# Patient Record
Sex: Male | Born: 1955 | Race: White | Hispanic: No | Smoking: Former smoker
Health system: Southern US, Community
[De-identification: ages and names within clinical notes are randomized; demographics above are authoritative.]

## PROBLEM LIST (undated history)

## (undated) DIAGNOSIS — F419 Anxiety disorder, unspecified: Secondary | ICD-10-CM

## (undated) DIAGNOSIS — J45909 Unspecified asthma, uncomplicated: Secondary | ICD-10-CM

## (undated) DIAGNOSIS — D369 Benign neoplasm, unspecified site: Secondary | ICD-10-CM

## (undated) DIAGNOSIS — M722 Plantar fascial fibromatosis: Secondary | ICD-10-CM

## (undated) DIAGNOSIS — K759 Inflammatory liver disease, unspecified: Secondary | ICD-10-CM

## (undated) DIAGNOSIS — M5020 Other cervical disc displacement, unspecified cervical region: Secondary | ICD-10-CM

## (undated) DIAGNOSIS — R51 Headache: Secondary | ICD-10-CM

## (undated) DIAGNOSIS — K649 Unspecified hemorrhoids: Secondary | ICD-10-CM

## (undated) DIAGNOSIS — Z91018 Allergy to other foods: Secondary | ICD-10-CM

## (undated) HISTORY — PX: TONSILLECTOMY: SUR1361

## (undated) HISTORY — DX: Unspecified asthma, uncomplicated: J45.909

## (undated) HISTORY — DX: Benign neoplasm, unspecified site: D36.9

## (undated) HISTORY — PX: ROTATOR CUFF REPAIR: SHX139

## (undated) HISTORY — DX: Unspecified hemorrhoids: K64.9

## (undated) HISTORY — PX: KNEE ARTHROSCOPY: SUR90

## (undated) HISTORY — PX: HEMORRHOID BANDING: SHX5850

---

## 2001-04-27 ENCOUNTER — Ambulatory Visit (HOSPITAL_BASED_OUTPATIENT_CLINIC_OR_DEPARTMENT_OTHER): Admission: RE | Admit: 2001-04-27 | Discharge: 2001-04-27 | Payer: Self-pay | Admitting: Otolaryngology

## 2001-04-27 ENCOUNTER — Encounter (INDEPENDENT_AMBULATORY_CARE_PROVIDER_SITE_OTHER): Payer: Self-pay | Admitting: Specialist

## 2003-12-18 ENCOUNTER — Encounter (HOSPITAL_COMMUNITY): Admission: RE | Admit: 2003-12-18 | Discharge: 2004-01-17 | Payer: Self-pay | Admitting: Preventative Medicine

## 2004-03-10 ENCOUNTER — Ambulatory Visit (HOSPITAL_BASED_OUTPATIENT_CLINIC_OR_DEPARTMENT_OTHER): Admission: RE | Admit: 2004-03-10 | Discharge: 2004-03-10 | Payer: Self-pay | Admitting: Orthopedic Surgery

## 2006-06-28 ENCOUNTER — Ambulatory Visit (HOSPITAL_COMMUNITY): Admission: RE | Admit: 2006-06-28 | Discharge: 2006-06-28 | Payer: Self-pay | Admitting: Family Medicine

## 2007-02-16 ENCOUNTER — Ambulatory Visit: Payer: Self-pay | Admitting: Internal Medicine

## 2007-02-24 ENCOUNTER — Ambulatory Visit: Payer: Self-pay | Admitting: Internal Medicine

## 2007-02-24 ENCOUNTER — Encounter: Payer: Self-pay | Admitting: Internal Medicine

## 2007-02-24 ENCOUNTER — Ambulatory Visit (HOSPITAL_COMMUNITY): Admission: RE | Admit: 2007-02-24 | Discharge: 2007-02-24 | Payer: Self-pay | Admitting: Internal Medicine

## 2007-02-24 HISTORY — PX: COLONOSCOPY: SHX174

## 2007-07-13 ENCOUNTER — Ambulatory Visit (HOSPITAL_BASED_OUTPATIENT_CLINIC_OR_DEPARTMENT_OTHER): Admission: RE | Admit: 2007-07-13 | Discharge: 2007-07-14 | Payer: Self-pay | Admitting: Orthopedic Surgery

## 2008-07-17 ENCOUNTER — Ambulatory Visit (HOSPITAL_COMMUNITY): Admission: RE | Admit: 2008-07-17 | Discharge: 2008-07-17 | Payer: Self-pay | Admitting: Orthopaedic Surgery

## 2009-03-22 ENCOUNTER — Encounter: Payer: Self-pay | Admitting: Emergency Medicine

## 2009-03-23 ENCOUNTER — Inpatient Hospital Stay (HOSPITAL_COMMUNITY): Admission: EM | Admit: 2009-03-23 | Discharge: 2009-03-26 | Payer: Self-pay | Admitting: Emergency Medicine

## 2009-05-13 ENCOUNTER — Encounter: Admission: RE | Admit: 2009-05-13 | Discharge: 2009-05-13 | Payer: Self-pay | Admitting: Neurosurgery

## 2009-06-02 ENCOUNTER — Ambulatory Visit (HOSPITAL_BASED_OUTPATIENT_CLINIC_OR_DEPARTMENT_OTHER): Admission: RE | Admit: 2009-06-02 | Discharge: 2009-06-02 | Payer: Self-pay | Admitting: Specialist

## 2010-02-20 ENCOUNTER — Ambulatory Visit
Admission: RE | Admit: 2010-02-20 | Discharge: 2010-02-20 | Payer: Self-pay | Source: Home / Self Care | Attending: Specialist | Admitting: Specialist

## 2010-02-23 ENCOUNTER — Encounter: Payer: Self-pay | Admitting: Orthopaedic Surgery

## 2010-02-23 LAB — POCT HEMOGLOBIN-HEMACUE: Hemoglobin: 15.5 g/dL (ref 13.0–17.0)
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
## 2010-04-15 ENCOUNTER — Encounter: Payer: Self-pay | Admitting: Internal Medicine

## 2010-04-21 LAB — POCT HEMOGLOBIN-HEMACUE: Hemoglobin: 15 g/dL (ref 13.0–17.0)

## 2010-04-22 LAB — DIFFERENTIAL
Basophils Absolute: 0 10*3/uL (ref 0.0–0.1)
Basophils Relative: 0 % (ref 0–1)
Eosinophils Absolute: 0 10*3/uL (ref 0.0–0.7)
Eosinophils Relative: 0 % (ref 0–5)
Lymphocytes Relative: 8 % — ABNORMAL LOW (ref 12–46)
Lymphs Abs: 1 10*3/uL (ref 0.7–4.0)
Monocytes Absolute: 0.7 10*3/uL (ref 0.1–1.0)
Monocytes Relative: 6 % (ref 3–12)
Neutro Abs: 10.3 10*3/uL — ABNORMAL HIGH (ref 1.7–7.7)
Neutrophils Relative %: 85 % — ABNORMAL HIGH (ref 43–77)

## 2010-04-22 LAB — CBC
HCT: 40.1 % (ref 39.0–52.0)
Hemoglobin: 14 g/dL (ref 13.0–17.0)
MCHC: 34.9 g/dL (ref 30.0–36.0)
MCV: 91.4 fL (ref 78.0–100.0)
Platelets: 188 10*3/uL (ref 150–400)
RBC: 4.39 MIL/uL (ref 4.22–5.81)
RDW: 12.5 % (ref 11.5–15.5)
WBC: 12.1 10*3/uL — ABNORMAL HIGH (ref 4.0–10.5)

## 2010-04-22 LAB — BASIC METABOLIC PANEL
BUN: 18 mg/dL (ref 6–23)
CO2: 22 mEq/L (ref 19–32)
Calcium: 8.5 mg/dL (ref 8.4–10.5)
Chloride: 104 mEq/L (ref 96–112)
Creatinine, Ser: 0.98 mg/dL (ref 0.4–1.5)
GFR calc Af Amer: >60 mL/min (ref >60)
GFR calc non Af Amer: >60 mL/min (ref >60)
Glucose, Bld: 180 mg/dL — ABNORMAL HIGH (ref 70–99)
Potassium: 4.1 mEq/L (ref 3.5–5.1)
Sodium: 135 mEq/L (ref 135–145)

## 2010-04-22 LAB — GLUCOSE, CAPILLARY: Glucose-Capillary: 104 mg/dL — ABNORMAL HIGH (ref 70–99)

## 2010-04-30 NOTE — Letter (Signed)
Summary: RECORDS FROM [facility]  RECORDS FROM [facility]   Imported By: Rexene Alberts 04/15/2010 15:44:59  _____________________________________________________________________  External Attachment:    Type:   Image     Comment:   External Document

## 2010-06-16 NOTE — H&P (Signed)
NAME:  DEMAURI, ADVINCULA                 ACCOUNT NO.:  [id number]   MEDICAL RECORD NO.:  [id number]         PATIENT TYPE:  AMB   LOCATION:  DAY                           FACILITY:  [facility]   PHYSICIAN:  R. Roetta Sessions, M.D. DATE OF BIRTH:  1955/06/13   DATE OF ADMISSION:  DATE OF DISCHARGE:  LH                              HISTORY & PHYSICAL   CHIEF COMPLAINT:  Intermittent hematochezia.   HISTORY OF PRESENT ILLNESS:  Mr. Brodersen is a 55 year old male.  He has a  one month history of intermittent hematochezia.  He tells me he has had  recently some diarrhea and has seen small to moderate amounts of dark  burgundy blood in his stool as well as in the water and on the toilet  paper with wiping.  He has been noticing some mid lower cramp-like  abdominal pain while at work.  He has tried multiple suppositories for  hemorrhoids without relief in his symptoms.  He also complains of  abdominal bloating and gas for the last six days now.  He has had normal  bowel movements, but continues to notice blood in his stools.  He denies  any NSAID use.  He tells me he had a colonoscopy by Dr. Ewing Schlein in  [place], but notes this was over four years ago and he is unsure as  to whether this was a complete colonoscopy or flexible sigmoidoscopy.  He tells me he had a polyp removed at that time as well.  He is unsure  as to whether this was adenomatous.  His weight is steadily increasing.  He denies any anorexia.  Denies any proctalgia or pruritus.   PAST MEDICAL AND SURGICAL HISTORY:  1. He has history of intermittent GERD well-controlled on p.r.n. over-      the-counter medicines.  He may have symptoms once or twice a month.  2. He has history of hepatitis C, genotype 2-B eradicated with      antiviral therapy with a negative PCR one year after cessation of      therapy.  3. He has history of IBS and hemorrhoids.  4. He has had left knee surgery.   CURRENT MEDICATIONS:  1. Xanax 0.5 mg p.r.n.  2.  Over-the-counter allergy pills p.r.n.  3. Over the counter antacids p.r.n.   ALLERGIES:  No known drug allergies.   FAMILY HISTORY:  Father alive at age 72.  He has history of prostate  carcinoma.  Mother deceased at 74 with GYN malignancy.  He has four  healthy siblings.   SOCIAL HISTORY:  Mr. Papillion is married.  He has two healthy children.  He  is a Curator for [employer].  He denies any tobacco or drug use.  He  consumes about two to three beers per week.   REVIEW OF SYSTEMS:  See HPI, otherwise negative.   PHYSICAL EXAMINATION:  VITAL SIGNS:  Weight 223 pounds, height 73  inches, temperature 98.3, blood pressure 130/88, pulse 72.  GENERAL:  Mr. Conde is a well-developed, well-nourished Caucasian male  in no acute distress.  HEENT:  Sclerae nonicteric.  Conjunctivae pink.  Oropharynx pink and  moist without any lesions.  CHEST:  Heart regular rate and rhythm.  Normal S1, S2.  No murmurs,  clicks, rubs or gallops.  Lungs are clear to auscultation bilaterally.  ABDOMEN:  Positive bowel sounds x4.  No bruits auscultated.  Soft,  nontender, nondistended without palpable mass or organomegaly.  He does  have a small easily reducible umbilical hernia which is nontender.  RECTAL:  Deferred.  EXTREMITIES: Without clubbing or edema bilaterally.  SKIN:  Pink, warm and dry without any rash or jaundice.   IMPRESSION:  Mr. Dipasquale is a 55 year old male with intermittent  hematochezia for one month now.  He did have some associated diarrhea  after being on antibiotics prior to the onset of symptoms.  Would be  concerned about pseudomembranous colitis.  Etiology of his bleeding is  obscure at this time and could be anything from benign anorectal source  such as fissure or hemorrhoids, ischemia, diverticular bleeding, or  colorectal carcinoma.  He is going to require further evaluation.   PLAN:  1. Will check CBC.  2. Colonoscopy with Dr. Jena Gauss in the near future.  I have discussed       this procedure including risks and benefits which include but are      not limited to infection, perforation, drug reaction.  He agrees to      the plan.  Consent will be obtained.  3. Will attempt to obtain colonoscopy report from Dr. Marlane Hatcher office      in [place], [place].      Lorenza Burton, N.P.      Jonathon Bellows, M.D.  Electronically Signed    KJ/MEDQ  D:  02/17/2007  T:  02/17/2007  Job:  [id number]   cc:   Patrica Duel, M.D.  Fax: [phone]

## 2010-06-16 NOTE — Op Note (Signed)
NAME:  Maurice Richards, Maurice Richards NO.:  [id number]   MEDICAL RECORD NO.:  [id number]          PATIENT TYPE:  AMB   LOCATION:  [facility]                          FACILITY:  [facility]   PHYSICIAN:  Katy Fitch. Sypher, M.D. DATE OF BIRTH:  03-Jul-1955   DATE OF PROCEDURE:  07/13/2007  DATE OF DISCHARGE:                               OPERATIVE REPORT   PREOPERATIVE DIAGNOSES:  A MRI documented 3:10 rotator cuff tear left,  shoulder with acromioclavicular joint degenerative arthritis and labral  degenerative changes.   POSTOPERATIVE DIAGNOSES:  Significant acromioclavicular joint AC general  arthritis with inferior projecting osteophytes and labral degenerative  changes at 3 o'clock anteriorly extending to approximately 10 o'clock  posteriorly, also moderate size reactive synovitis due to 3:10 rotator  cuff tear including superior 30% of subscapularis retracted and entire  supraspinatus, infraspinatus retracted.   OPERATIONS:  1. Diagnostic arthroscopy, left shoulder.  2. Arthroscopic debridement of rotator cuff tear, synovitis and      labrum.  3. Arthroscopic subacromial decompression with bursectomy,      coracoacromial ligament relaxation.  4. Arthroscopic distal clavicle resection.  5. Open reconstruction of subscapularis, supraspinatus, infraspinatus      rotator cuff tear utilizing 3 Bio-Corkscrew medial footprint      anchors, 2 McLaughlin through bone sutures and a swivel lock over-      the-top inset for the supraspinatus and infraspinatus.   SURGEON:  Katy Fitch. Sypher, MD   ASSISTANT:  Marveen Reeks Dasnoit, PA   ANESTHESIA:  General by endotracheal technique supplemented by a left  interscalene block.   SUPERVISING ANESTHESIOLOGIST:  Germaine Pomfret, MD   INDICATIONS:  Maurice Richards is a 55 year old gentleman who is well  acquainted with our practice.   Three years prior I repaired a large right rotator cuff tear.   He returned to his usual occupation at [employer] in  [place], [place].   In May 2009, he sustained a new injury to his left shoulder and was  initially evaluated by Dr. Wende Crease and found on clinical examination to  have weakness of the shoulder.  Dr. Wende Crease ultimately obtained an MRI  documenting rotator cuff tear.  Maurice Richards requested a return to our  practice for definitive care of his shoulder as he was very please with  the outcome on the right side.   After informed consent, he is brought to the operating room at this  time.  Preoperatively, he was advised that we would essentially repeated  process on the right side with repair of the left rotator cuff.  We  anticipate arthroscopic debridement of his joint, subacromial  decompression, distal clavicle resection followed by open reconstruction  of his complex 3:10 rotator cuff tear.  Preoperatively, questions  invited and answered in detail.  We reviewed his anticipated aftercare  work impairment and return to work schedule.  Questions were invited and  answered detail both in the office in the preop holding area.   PROCEDURE:  Maurice Richards is brought to the operating room and placed in  supine position on  the operating table.   Following the induction of general anesthesia by endotracheal technique,  he was carefully positioned in beach-chair position with the aid of a  torso and head holder designed for shoulder arthroscopy.   The left arm and forequarter prepped with DuraPrep and draped in  impervious arthroscopy drapes.  One gram of Ancef was administered as IV  prophylactic antibiotic.   Procedure commenced with placement of the arthroscope through a standard  posterior viewing portal.   Diagnostic arthroscopy confirmed intact hyaline articular cartilage  surfaces on the glenoid and humeral head.  The labrum was degenerative  in 3 o'clock anteriorly to 10 o'clock posteriorly with fragments  impinging within the joint.  The biceps had a 20%  degenerative tear that  was debrided.   The deep surface rotator cuff was debrided of fragments of degenerative  tendon and an anterior synovectomy was accomplished.   After photographic documentation of the tear predicament, the  arthroscope was removed in glenohumeral joint and placed subacromial  space.  After bursectomy, the coracoacromial ligament relaxed with  cutting cautery followed by release of the capsule of AC joint and  resection of the distal 15 mm clavicle with arthroscopic technique.  The  acromion will be leveled with an oscillating saw.   After removal of the arthroscopic clip, we proceeded to an open muscle-  splitting incision.  The anterior middle thirds of the deltoid were  split through a 5 cm longitudinal incision allowing access to the bursa.  Redundant bursa was removed followed by exposure of the subscapularis  tear and the obvious retracted infraspinatus, supraspinatus tears.  A  power bur was used to decorticate the lesser tuberosity and greater  tuberosity, lowering the profile of the tuberosity 3 mm.  For repair of  the subscapularis, a pair of mattress sutures were placed in the  subscapularis, repairing the subscapularis and reestablishing the biceps  sling.  The infraspinatus and supraspinatus were both repaired with a  medial footprint suture followed by 2 McLaughlin through bone sutures  restoring then to the lateral footprint.  A swivel lock was used with  over-the-top technique, taking 2 of the Bio-Corkscrew sutures over-the-  top to a lateral anchor point, insetting the margin of the repair.  The  free margin of the repair was then repaired with a series of figure-of-  eight sutures of 0 Vicryl.   A very anatomic repair was achieved with good opposition to the lesser  and greater tuberosities.   The lateral lip of the acromion was removed with an oscillating saw  followed by use of a hand rasp to create a beveled edge.  After  irrigation of  the subacromial space, the muscle split was repaired with  corner suture of #2 FiberWire followed by simple suture of 0 Vicryl.  There were no apparent complications.   Maurice Richards wound was closed with subcutaneous suture of 2-0 Vicryl, an  intradermal 3-0 Prolene and Steri-Strips.   He is was placed in a sling and transferred to the recovery room with  stable signs.   He will be admitted to the recovery care center for observation of his  vital signs and provision of appropriate prophylactic antibiotics in  form of Ancef 1 gram IV q.8 h. x3 doses and use of PCA morphine on as-  needed basis.  If his block is satisfactory, he will be discharged in  the morning with oral medication.      Katy Fitch Sypher, M.D.  Electronically  Signed     RVS/MEDQ  D:  07/13/2007  T:  07/14/2007  Job:  [id number]   cc:   Laverle Hobby, M.D.

## 2010-06-16 NOTE — Op Note (Signed)
NAME:  Maurice Richards, Maurice Richards                 ACCOUNT NO.:  [id number]   MEDICAL RECORD NO.:  [id number]          PATIENT TYPE:  AMB   LOCATION:  DAY                           FACILITY:  [facility]   PHYSICIAN:  R. Roetta Sessions, M.D. DATE OF BIRTH:  1955-02-17   DATE OF PROCEDURE:  02/24/2007  DATE OF DISCHARGE:                               OPERATIVE REPORT   COLONOSCOPY WITH BIOPSY:   INDICATIONS FOR PROCEDURE:  A 55 year old gentleman with recent  intermittent hematochezia.  He had had diarrhea, temporally related to  some antibiotics for a sinus infection over the Christmas holidays.  Diarrhea has subsided.  At times he has to strain to have a bowel  movement at this time.  There is no family history of colorectal  neoplasia.  He reportedly had a colonoscopy some polyps removed (could  have been just a flexible sigmoidoscopy) down in [place] a few years  back.  Records unavailable and colonoscopy is now being done.  This  approach has been discussed with the patient at length.  Potential  risks, benefits and alternatives have been reviewed, questions answered.  Please note, CBC from our office on February 20, 2007, was completely  normal with a white count of 8.9, H&H 13.6 and 41.4, MCV 89.6.   There is no family history of colorectal neoplasia.  This approach has  been discussed with the patient at length.  The risks, benefits,  alternatives and limitations have been reviewed.  He is agreeable.  Please see documentation in the medical record.   PROCEDURE NOTE:  O2 saturation, blood pressure, pulse and respirations  were monitored throughout entirety of the procedure.  Conscious  sedation:  Versed 5 mg IV, Demerol 100 mg IV in divided doses.  Instrument:  Pentax video chip system.   FINDINGS:  Digital rectal exam revealed an anal canal hemorrhoidal tag.  Endoscopic findings:  The prep was good.   Colon:  The colonic mucosa was surveyed from the rectosigmoid junction  through the left,  transverse  and right colon to this area of the  appendiceal orifice, ileocecal valve and cecum.  These structures well-  seen and photographed for the record.  From this level the scope was  slowly withdrawn and all previously-mentioned mucosal surfaces were  again seen.  The patient had two diminutive polyps in the mid descending  colon, which were cold biopsied/removed.  The remainder of the colonic  mucosa appeared normal aside from a couple of areas of tiny submucosal  petechiae on a couple folds, felt to be clinically insignificant,  the  scope was pulled down to the rectum, where a thorough examination of the  rectal mucosa including a retroflexed view of the anal verge and en face  view of the anal canal demonstrated friable anal canal hemorrhoids.  The  patient tolerated the procedure well and was reacted in endoscopy.   IMPRESSION:  1. Friable anal canal hemorrhoids, otherwise normal rectum.  2. Diminutive mid descending colon polyps, status post cold biopsy      removal.  3. A couple of submucosal petechiae on a  couple folds in the left      colon of doubtful clinical significance.  4. Remainder of colonic mucosa appeared normal.   RECOMMENDATIONS:  1. Hemorrhoid literature provided Maurice Richards.  2. A 10-day course of Anusol-HC suppositories, one per rectum at      bedtime.  3. Daily fiber supplement, way of Benefiber 1 tablespoon daily.  4. Follow up on pathology.  5. Further recommendations to follow.      Jonathon Bellows, M.D.  Electronically Signed     RMR/MEDQ  D:  02/24/2007  T:  02/24/2007  Job:  [id number]   cc:   Patrica Duel, M.D.  Fax: [phone]
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
## 2010-06-19 NOTE — H&P (Signed)
[facility]  Patient:    Maurice Richards, LESSLEY Visit Number: [id number] MRN: [id number]          Service Type: DSU Location: [facility] Attending Physician:  Waldon Merl Dictated by:   Keturah Barre, M.D. Admit Date:  04/27/2001   CC:         Jonell Cluck, M.D.   History and Physical  HISTORY OF PRESENT ILLNESS:  This patient is a 55 year old male who I first saw who had had nasal surgery in 1980 after he severely fractured his nose. 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  He apparently also has some increased liver enzymes and is being worked up for hepatitis where he has apparently been exposed to a considerable number of chemicals; they are slightly concerned about this; this, however, has been evaluated and found to be okay.  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  His further history is one of associated sinus infections where he has had six to eight infections over the past year.  He has been on Z-Pak, doxycycline, again Z-Pak.  He continues to use also nasal sprays, Nasonex, decongestants like ______ and continues to have sinus headache with sinusitis.  After treatment on several occasions, we ordered a CAT scan and this showed bilateral maxillary and ethmoid sinusitis and also the septal deviation, which is in the high ethmoid region on the left and in the vomerine region on the right.  He now enters for a septal reconstruction/revision with turbinate reduction with a functional endoscopic sinus  surgery of bilateral ethmoidectomy, bilateral maxillary sinus ostial enlargement.  ALLERGIES:  He has no allergies to medications.  PAST MEDICAL HISTORY:  His past history is remarkable in the fact that he had a tonsillectomy as a child.  He has had two knee surgeries on the left side. He was also admitted for a spider bite to [facility], which was quite severe.  He also goes back, speaking of the nasal fracture that led to his nasal surgery.  He has had some upset stomach recently but feels it is just secondary to the anxiety, pre-surgery.  He has never had any bleeding problems.  He smokes a cigar about every two to three weeks.  He will drink some alcohol, about a six-pack a week and his hemoglobin is 16.4.  PHYSICAL EXAMINATION:  VITAL SIGNS:  His physical examination reveals a blood pressure of 123/78.  He is 6 feet 2 inches, 214.  Heart rate is 54.  HEENT:  The ears are clear.  Tympanic membranes look excellent.  Nose shows the history of previous surgery and history of trauma.  The septum is deviated to the left on the high ethmoid region blocking the entire area of ethmoid drainage and the right vomerine region more inferiorly within his nose.  The nose shows considerable scarring and thickening of the septum typical of a previous fractured and operated nose.  Oral cavity is clear.  No evidence of any ulceration or mass.  His larynx is clear.  True cords, false cords, epiglottis, base of tongue  are within normal limits.  NECK:  Free of any thyromegaly, cervical adenopathy or mass.  CHEST:  Clear.  No rales, rhonchi or wheezes.  CARDIOVASCULAR:  No opening snaps, murmurs or gallops.  ABDOMEN:  Unremarkable except for the stomach feeling of nausea.  EXTREMITIES:  His extremities are unremarkable except for the left knee, which has had two previous knee surgeries; he is doing well now.  INITIAL DIAGNOSES: 1. Septal deviation. 2. History of septal and nasal  surgery. 3. History of nasal trauma. 4. History of knee surgery. 5. History of spider bite. 6. History of smoking cigar and alcohol intake. 7. History of evaluation for hepatitis.  PLAN:  Our plan is for a functional endoscopic sinus surgery and a septal reconstruction. Dictated by:   Keturah Barre, M.D. Attending Physician:  Waldon Merl DD:  04/27/01 TD:  04/27/01 Job: [id number]

## 2010-06-19 NOTE — Op Note (Signed)
NAME:  Maurice Richards, Maurice Richards NO.:  [id number]   MEDICAL RECORD NO.:  [id number]          PATIENT TYPE:  AMB   LOCATION:  DSC                          FACILITY:  [facility]   PHYSICIAN:  Katy Fitch. 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 Identification of a significant degenerative labral tear extending  from 12 o'clock superiorly to 10 o'clock posteriorly and moderate intra-  articular synovitis of glenohumeral joint.   OPERATION:  1.  Diagnostic arthroscopy of right shoulder documenting a retracted rotator      cuff tear and considerable synovitis and labral tearing.  2.  Arthroscopic debridement of labrum, synovectomy and deep surface rotator      cuff debridement of necrotic cuff.  3.  Open reconstruction of teres minor infraspinatus and supraspinatus      rotator cuff tear utilizing three biocorkscrew anchors and two      McLaughlin through bone sutures with a finishing suture of #0Vicryl.  4.  Open resection of distal clavicle, i.e. Mumford procedure.   SURGEON:  Josephine Igo, M.D.   ASSISTANT:  Annye Rusk, PA-C.   ANESTHESIA:  General endotracheal supplemented by interscalene block,  supervising anesthesiologist, Zenon Mayo, M.D.   INDICATIONS:  Maurice Richards is a 55 year old employee of the [employer]   Approximately 6 months ago, he injured his right shoulder and was initially  evaluated by Dr.  Laverle Hobby of [facility].   Dr. Wende Crease identified shoulder pain, limited motion, and weakness. He  referred Maurice Richards for evaluation with Dr. Annell Greening at [facility]. Dr. Ophelia Charter made a clinical diagnosis of AC arthropathy and  probable rotator cuff tear. An MRI was obtained demonstrating a full  thickness retracted rotator cuff tear.   Dr. Ophelia Charter recommended proceeding with rotator cuff repair, decompression and  distal clavicle resection.   As Maurice Richards has had a prior relationship with our practice and preferred to  stay with the [facility],  he petitioned the industrial  commission for transfer of his care and was granted a transfer of treating  physician to his home based orthopedic doctors.   After informed consent during which we advised him to expect reconstruction  of the rotator cuff as well as subacromial decompression and distal clavicle  resection, he is brought to the operating at this time. He understands we  are going to perform initial arthroscopic evaluation of the shoulder to  ascertain the condition of the glenohumeral joint, the biceps tendon and  origin,  and the labrum.   After informed consent,  he is brought to the operating this time.   PROCEDURE:  Maurice Richards is brought to the operating room and placed in  supine position on the table.   Following the induction of general anesthesia under the direct supervision  of Dr. Autumn Patty, he was carefully positioned in the beach-chair  position with __________ designed for shoulder arthroscopy.   The entire right upper extremity __________ are prepped with DuraPrep and  draped with impervious arthroscopy drapes.   The shoulder joint was distended with 20 cc of sterile saline, delivered  through an anterior 20 gauge spinal needle.   The scope was introduced atraumatically. Arthroscopic evaluation of the  glenohumeral joint revealed intact hyaline  articular cartilage surfaces of  the humeral head and glenoid. The labrum was intact anteriorly and the  anterior glenohumeral ligaments were normal. The inferior recess was normal  with the exception of minimal synovitis. The biceps anchor was sound. The  posterior labrum was completely torn from 12 o'clock posteriorly to  approximately 10 o'clock and had peeled back medial to the glenoid rim.  Fragments of the labrum were debrided with a 4.5 mm suction shaver brought  in anteriorly.   The rotator cuff was inspected and found be intact at the site of the  subscapularis. There was partial tearing around the rotator interval. The  suction shaver was used to remove synovium to carefully inspect the  supraspinatus, infraspinatus, and teres minor. There was a necrotic  retracted delaminated tear of the supraspinatus, infraspinatus and a portion  the teres minor that had retracted at least 2.5 cm medially.   This appeared to be mobile enough to allow repair.   After synovectomy and hemostasis, the arthroscopic equipment was removed  from the glenohumeral joint and we proceeded directly to an open  reconstruction of the rotator cuff.   A 7 cm longitudinal incision was fashioned from the [facility] joint laterally  across the anterior acromion. Care was taken to incise the [facility] capsule and  subperiosteally expose the distal 15 mm of clavicle.   The clavicle was removed with an oscillating saw after careful placement of  baby Bennett retractors to protect the rotator cuff and other soft tissue  structures.   The anterior deltoid was elevated with an osteotome and the coracoacromial  ligament released with a 15 scalpel blade.   Hemostasis was achieved with DeBakey forceps and the Bovie cautery. The  bursa was extremely thickened. A bursectomy was performed to allow  visualization of the cuff tear predicament.   The margins of the cuff were sharply debrided creating a triangular defect. The  delaminated cuff was debrided with a rongeur freshening the margins  between the layers of the cuff followed by placement of a #2 FiberWire up  and down sewing machine type suture to repair the delamination. This was  then gathered as a baseball stitch to reapproximate the supraspinatus and  the retracted infraspinatus.   The teres minor was retracted and the greater tuberosity decorticated with  an osteotome and rongeur. The full length of the greater tuberosity creating  a foot print measuring approximately 2 cm in width and approximately 5 cm  across the tuberosity.   A biocorkscrew anchor was placed at the center of the teres minor,  infraspinatus and supraspinatus along the medial border of the trough  creating a broad footprint for reconstruction of the cuff.   An apical grasping suture was used to close the interval between the  supraspinatus and infraspinatus  and placed through bone tunnels with a  crisscross stitch, reapproximating the supraspinatus, infraspinatus in  virtually anatomic position. The teres minor was inset with a pair mattress  sutures from the most posterior biocorkscrew anchor followed by medial  placement of the margin of the infraspinatus and supraspinatus with  biocorkscrew anchors placed for each tendon.   A second #2 FiberWire was then placed with grasping technique to further  lateralize the insertion of the supraspinatus, infraspinatus and pass  through bone tunnels and tied over cortex creating a very sound McLaughlin  type lateral repair.   The supraspinatus and infraspinatus mattress sutures were then applied,  tensioned appropriately to inset the tendon in the trough created by  decortication creating a low profile repair.   The anterior acromion had a very prominent osteophyte at the medial facette  for the clavicle and a rather prominent anterior osteophyte probably a  traction osteophyte from the coracoacromial ligament.   Approximately 8  mm of the anterior acromion was removed creating a type 1  morphology.   A finishing suture was used to repair the bursa over the repair for a very  smooth repair covering the knots placed laterally.   Range of motion of the shoulder was examined and found be quite free. The  bursa was digitally palpated and thoroughly irrigated to remove all clot and  foreign material.   The anterior deltoid was then repaired to the trapezius muscle closing the  dead space created by distal clavicle resection, followed by repair the  anterior third of the deltoid to the capsule of the [facility] joint and acromion  with mattress sutures of #2 FiberWire.   The deltoid split was less than 2 cm and was repaired with simple suture of  #0 Vicryl.   The wound was lavaged with sterile saline followed by repair of the skin  with subdermal sutures of 3-0 Vicryl and intradermal 3-0 Prolene with Steri-  Strips.   There were no apparent complications.  Maurice Richards will be admitted to recovery care center for observation of his  vital signs and appropriate analgesics in the form of IV PCA morphine,  p.o.  and IV Dilaudid. He is anticipating three doses of Ancef 1 g q.8 h as a  prophylactic antibiotic.   He will be discharged on to the morning of March 11, 2004 to home.      RVS/MEDQ  D:  03/10/2004  T:  03/10/2004  Job:  [id number]

## 2010-09-18 ENCOUNTER — Other Ambulatory Visit (HOSPITAL_COMMUNITY): Payer: Self-pay

## 2010-09-25 ENCOUNTER — Ambulatory Visit (HOSPITAL_COMMUNITY): Admission: RE | Admit: 2010-09-25 | Payer: BC Managed Care – PPO | Source: Ambulatory Visit | Admitting: General Surgery

## 2010-10-29 LAB — BASIC METABOLIC PANEL
BUN: 15
CO2: 26
Calcium: 9.2
Chloride: 107
Creatinine, Ser: 1.11
GFR calc Af Amer: >60
GFR calc non Af Amer: >60
Glucose, Bld: 109 — ABNORMAL HIGH
Potassium: 4.3
Sodium: 141

## 2010-10-29 LAB — POCT HEMOGLOBIN-HEMACUE: Hemoglobin: 15.2

## 2012-01-12 ENCOUNTER — Encounter: Payer: Self-pay | Admitting: *Deleted

## 2012-02-15 ENCOUNTER — Telehealth: Payer: Self-pay | Admitting: *Deleted

## 2012-02-15 NOTE — Telephone Encounter (Signed)
I left a message for Maurice Richards today to call our office to schedule an appointment for his colonoscopy.

## 2013-10-17 NOTE — H&P (Signed)
  NTS SOAP Note  Vital Signs:  Vitals as of: 4/96/7591: Systolic 638: Diastolic 92: Heart Rate 70: Temp 97.20F: Height 65ft 1in: Weight 232Lbs 0 Ounces: Pain Level 3: BMI 30.61  BMI : 30.61 kg/m2  Subjective: This 58 year old male presents for of an umbilical hernia.  Has been present for some time.  Made worse with straining.  Painful more frequently lately.  Review of Symptoms:  Constitutional:unremarkable   headache sore throat,  simus issues Cardiovascular:  unremarkable Respiratory:dyspnea Gastrointestinal:  unremarkable   Genitourinary:unremarkable   joint and back pain dyr Hematolgic/Lymphatic:unremarkable   Allergic/Immunologic:unremarkable   Past Medical History:  Reviewed  Past Medical History  Surgical History: unremarkable Medical Problems: h/o hep C Allergies: nkda Medications: zyrtec,  roxicodone,  alprazolam,  flonase,  librax   Social History:Reviewed  Social History  Preferred Language: English Race:  White Ethnicity: Not Hispanic / Latino Age: 58 year Marital Status:  D Alcohol: rarely   Smoking Status: Never smoker reviewed on 10/16/2013 Functional Status reviewed on 10/16/2013 ------------------------------------------------ Bathing: Normal Cooking: Normal Dressing: Normal Driving: Normal Eating: Normal Managing Meds: Normal Oral Care: Normal Shopping: Normal Toileting: Normal Transferring: Normal Walking: Normal Cognitive Status reviewed on 10/16/2013 ------------------------------------------------ Attention: Normal Decision Making: Normal Language: Normal Memory: Normal Motor: Normal Perception: Normal Problem Solving: Normal Visual and Spatial: Normal   Family History:Reviewed  Family Health History Mother, Deceased; Cancer unspecified;  Father, Deceased; Prostate cancer;     Objective Information: General:Well appearing, well nourished in no distress. Heart:RRR, no murmur or gallop.  Normal  S1, S2.  No S3, S4.  Lungs:  CTA bilaterally, no wheezes, rhonchi, rales.  Breathing unlabored. Abdomen:Soft, NT/ND, no HSM, no masses.  Reducible umbilical hernia noted.  Assessment:Umbilical hernia  Diagnoses: 466.5  L93.5 Umbilical hernia (Umbilical hernia without obstruction or gangrene)  Procedures: 70177 - OFFICE OUTPATIENT NEW 30 MINUTES    Plan:  Scheduled for umbilical herniorrhaphy with mesh on 10/31/13.   Patient Education:Alternative treatments to surgery were discussed with patient (and family).  Risks and benefits  of procedure including bleeding,  infection,  and reccurrence of the hernia  were fully explained to the patient (and family) who gave informed consent. Patient/family questions were addressed.  Follow-up:Pending Surgery

## 2013-10-22 ENCOUNTER — Encounter (HOSPITAL_COMMUNITY): Payer: Self-pay

## 2013-10-22 ENCOUNTER — Encounter (HOSPITAL_COMMUNITY)
Admission: RE | Admit: 2013-10-22 | Discharge: 2013-10-22 | Disposition: A | Discharge status: Home / Self Care | Payer: BC Managed Care – PPO | Source: Ambulatory Visit | Attending: General Surgery | Admitting: General Surgery

## 2013-10-22 DIAGNOSIS — Z01810 Encounter for preprocedural cardiovascular examination: Secondary | ICD-10-CM | POA: Insufficient documentation

## 2013-10-22 DIAGNOSIS — Z01812 Encounter for preprocedural laboratory examination: Secondary | ICD-10-CM | POA: Insufficient documentation

## 2013-10-22 DIAGNOSIS — K429 Umbilical hernia without obstruction or gangrene: Secondary | ICD-10-CM | POA: Diagnosis present

## 2013-10-22 HISTORY — DX: Anxiety disorder, unspecified: F41.9

## 2013-10-22 HISTORY — DX: Other cervical disc displacement, unspecified cervical region: M50.20

## 2013-10-22 HISTORY — DX: Headache: R51

## 2013-10-22 HISTORY — DX: Inflammatory liver disease, unspecified: K75.9

## 2013-10-22 LAB — BASIC METABOLIC PANEL
Anion gap: 10 (ref 5–15)
BUN: 20 mg/dL (ref 6–23)
CO2: 29 mEq/L (ref 19–32)
Calcium: 8.9 mg/dL (ref 8.4–10.5)
Chloride: 100 mEq/L (ref 96–112)
Creatinine, Ser: 1.14 mg/dL (ref 0.50–1.35)
GFR calc Af Amer: 81 mL/min — ABNORMAL LOW (ref >90)
GFR calc non Af Amer: 70 mL/min — ABNORMAL LOW (ref >90)
Glucose, Bld: 98 mg/dL (ref 70–99)
Potassium: 4.5 mEq/L (ref 3.7–5.3)
Sodium: 139 mEq/L (ref 137–147)

## 2013-10-22 LAB — CBC WITH DIFFERENTIAL/PLATELET
Basophils Absolute: 0 10*3/uL (ref 0.0–0.1)
Basophils Relative: 1 % (ref 0–1)
Eosinophils Absolute: 0.3 10*3/uL (ref 0.0–0.7)
Eosinophils Relative: 4 % (ref 0–5)
HCT: 43.4 % (ref 39.0–52.0)
Hemoglobin: 14.7 g/dL (ref 13.0–17.0)
Lymphocytes Relative: 33 % (ref 12–46)
Lymphs Abs: 2.3 10*3/uL (ref 0.7–4.0)
MCH: 31.3 pg (ref 26.0–34.0)
MCHC: 33.9 g/dL (ref 30.0–36.0)
MCV: 92.5 fL (ref 78.0–100.0)
Monocytes Absolute: 0.4 10*3/uL (ref 0.1–1.0)
Monocytes Relative: 5 % (ref 3–12)
Neutro Abs: 4.2 10*3/uL (ref 1.7–7.7)
Neutrophils Relative %: 57 % (ref 43–77)
Platelets: 237 10*3/uL (ref 150–400)
RBC: 4.69 MIL/uL (ref 4.22–5.81)
RDW: 12.5 % (ref 11.5–15.5)
WBC: 7.2 10*3/uL (ref 4.0–10.5)

## 2013-10-22 NOTE — Patient Instructions (Signed)
Maurice Richards  10/22/2013   Your procedure is scheduled on:  10/31/2013  Report to [facility] at  66  AM.  Call this number if you have problems the morning of surgery: [phone]   Remember:   Do not eat food or drink liquids after midnight.   Take these medicines the morning of surgery with A SIP OF WATER:  none   Do not wear jewelry, make-up or nail polish.  Do not wear lotions, powders, or perfumes.   Do not shave 48 hours prior to surgery. Men may shave face and neck.  Do not bring valuables to the hospital.  [facility] is not responsible for any belongings or valuables.               Contacts, dentures or bridgework may not be worn into surgery.  Leave suitcase in the car. After surgery it may be brought to your room.  For patients admitted to the hospital, discharge time is determined by your treatment team.               Patients discharged the day of surgery will not be allowed to drive home.  Name and phone number of your driver: family  Special Instructions: Shower using CHG 2 nights before surgery and the night before surgery.  If you shower the day of surgery use CHG.  Use special wash - you have one bottle of CHG for all showers.  You should use approximately 1/3 of the bottle for each shower.   Please read over the following fact sheets that you were given: Pain Booklet, Coughing and Deep Breathing, Surgical Site Infection Prevention, Anesthesia Post-op Instructions and Care and Recovery After Surgery Hernia A hernia occurs when an internal organ pushes out through a weak spot in the abdominal wall. Hernias most commonly occur in the groin and around the navel. Hernias often can be pushed back into place (reduced). Most hernias tend to get worse over time. Some abdominal hernias can get stuck in the opening (irreducible or incarcerated hernia) and cannot be reduced. An irreducible abdominal hernia which is tightly squeezed into the opening is at risk for impaired  blood supply (strangulated hernia). A strangulated hernia is a medical emergency. Because of the risk for an irreducible or strangulated hernia, surgery may be recommended to repair a hernia. CAUSES   Heavy lifting.  Prolonged coughing.  Straining to have a bowel movement.  A cut (incision) made during an abdominal surgery. HOME CARE INSTRUCTIONS   Bed rest is not required. You may continue your normal activities.  Avoid lifting more than 10 pounds (4.5 kg) or straining.  Cough gently. If you are a smoker it is best to stop. Even the best hernia repair can break down with the continual strain of coughing. Even if you do not have your hernia repaired, a cough will continue to aggravate the problem.  Do not wear anything tight over your hernia. Do not try to keep it in with an outside bandage or truss. These can damage abdominal contents if they are trapped within the hernia sac.  Eat a normal diet.  Avoid constipation. Straining over long periods of time will increase hernia size and encourage breakdown of repairs. If you cannot do this with diet alone, stool softeners may be used. SEEK IMMEDIATE MEDICAL CARE IF:   You have a fever.  You develop increasing abdominal pain.  You feel nauseous or vomit.  Your hernia is stuck  outside the abdomen, looks discolored, feels hard, or is tender.  You have any changes in your bowel habits or in the hernia that are unusual for you.  You have increased pain or swelling around the hernia.  You cannot push the hernia back in place by applying gentle pressure while lying down. MAKE SURE YOU:   Understand these instructions.  Will watch your condition.  Will get help right away if you are not doing well or get worse. Document Released: 01/18/2005 Document Revised: 04/12/2011 Document Reviewed: 09/07/2007 [facility] Patient Information 2015 [place], [place]. This information is not intended to replace advice given to you by your health care  provider. Make sure you discuss any questions you have with your health care provider. PATIENT INSTRUCTIONS POST-ANESTHESIA  IMMEDIATELY FOLLOWING SURGERY:  Do not drive or operate machinery for the first twenty four hours after surgery.  Do not make any important decisions for twenty four hours after surgery or while taking narcotic pain medications or sedatives.  If you develop intractable nausea and vomiting or a severe headache please notify your doctor immediately.  FOLLOW-UP:  Please make an appointment with your surgeon as instructed. You do not need to follow up with anesthesia unless specifically instructed to do so.  WOUND CARE INSTRUCTIONS (if applicable):  Keep a dry clean dressing on the anesthesia/puncture wound site if there is drainage.  Once the wound has quit draining you may leave it open to air.  Generally you should leave the bandage intact for twenty four hours unless there is drainage.  If the epidural site drains for more than 36-48 hours please call the anesthesia department.  QUESTIONS?:  Please feel free to call your physician or the hospital operator if you have any questions, and they will be happy to assist you.

## 2013-10-22 NOTE — Pre-Procedure Instructions (Signed)
Patient given information to sign up for my chart at home. 

## 2013-10-25 ENCOUNTER — Other Ambulatory Visit (HOSPITAL_COMMUNITY): Payer: BC Managed Care – PPO

## 2013-10-31 ENCOUNTER — Encounter (HOSPITAL_COMMUNITY): Payer: BC Managed Care – PPO | Admitting: Anesthesiology

## 2013-10-31 ENCOUNTER — Encounter (HOSPITAL_COMMUNITY): Payer: Self-pay | Admitting: *Deleted

## 2013-10-31 ENCOUNTER — Ambulatory Visit (HOSPITAL_COMMUNITY)
Admission: RE | Admit: 2013-10-31 | Discharge: 2013-10-31 | Disposition: A | Discharge status: Home / Self Care | Payer: BC Managed Care – PPO | Source: Ambulatory Visit | Attending: General Surgery | Admitting: General Surgery

## 2013-10-31 ENCOUNTER — Encounter (HOSPITAL_COMMUNITY)
Admission: RE | Disposition: A | Discharge status: Home / Self Care | Payer: Self-pay | Source: Ambulatory Visit | Attending: General Surgery

## 2013-10-31 ENCOUNTER — Ambulatory Visit (HOSPITAL_COMMUNITY): Payer: BC Managed Care – PPO | Admitting: Anesthesiology

## 2013-10-31 DIAGNOSIS — Z79899 Other long term (current) drug therapy: Secondary | ICD-10-CM | POA: Insufficient documentation

## 2013-10-31 DIAGNOSIS — K429 Umbilical hernia without obstruction or gangrene: Secondary | ICD-10-CM | POA: Diagnosis present

## 2013-10-31 DIAGNOSIS — F411 Generalized anxiety disorder: Secondary | ICD-10-CM | POA: Insufficient documentation

## 2013-10-31 HISTORY — PX: INSERTION OF MESH: SHX5868

## 2013-10-31 HISTORY — PX: UMBILICAL HERNIA REPAIR: SHX196

## 2013-10-31 SURGERY — REPAIR, HERNIA, UMBILICAL, ADULT
Anesthesia: General

## 2013-10-31 MED ORDER — FENTANYL CITRATE 0.05 MG/ML IJ SOLN
25.0000 ug | INTRAMUSCULAR | Status: AC
  Administered 2013-10-31 (×2): 25 ug via INTRAVENOUS

## 2013-10-31 MED ORDER — 0.9 % SODIUM CHLORIDE (POUR BTL) OPTIME
TOPICAL | Status: DC | PRN
  Administered 2013-10-31: 1000 mL

## 2013-10-31 MED ORDER — MIDAZOLAM HCL 2 MG/2ML IJ SOLN
1.0000 mg | INTRAMUSCULAR | Status: DC | PRN
  Administered 2013-10-31: 2 mg via INTRAVENOUS

## 2013-10-31 MED ORDER — PROPOFOL 10 MG/ML IV EMUL
INTRAVENOUS | Status: AC
  Filled 2013-10-31: qty 20

## 2013-10-31 MED ORDER — FENTANYL CITRATE 0.05 MG/ML IJ SOLN
INTRAMUSCULAR | Status: AC
  Filled 2013-10-31: qty 2

## 2013-10-31 MED ORDER — FENTANYL CITRATE 0.05 MG/ML IJ SOLN
INTRAMUSCULAR | Status: AC
  Filled 2013-10-31: qty 5

## 2013-10-31 MED ORDER — OXYCODONE-ACETAMINOPHEN 7.5-325 MG PO TABS: 1.0000 | ORAL_TABLET | ORAL | Status: DC | PRN

## 2013-10-31 MED ORDER — ONDANSETRON HCL 4 MG/2ML IJ SOLN: 4.0000 mg | Freq: Once | INTRAMUSCULAR | Status: DC | PRN

## 2013-10-31 MED ORDER — GLYCOPYRROLATE 0.2 MG/ML IJ SOLN
INTRAMUSCULAR | Status: AC
  Filled 2013-10-31: qty 1

## 2013-10-31 MED ORDER — ONDANSETRON HCL 4 MG/2ML IJ SOLN
4.0000 mg | Freq: Once | INTRAMUSCULAR | Status: AC
  Administered 2013-10-31: 4 mg via INTRAVENOUS

## 2013-10-31 MED ORDER — POVIDONE-IODINE 10 % EX OINT
TOPICAL_OINTMENT | CUTANEOUS | Status: AC
  Filled 2013-10-31: qty 1

## 2013-10-31 MED ORDER — CEFAZOLIN SODIUM-DEXTROSE 2-3 GM-% IV SOLR
2.0000 g | INTRAVENOUS | Status: AC
  Administered 2013-10-31: 2 g via INTRAVENOUS
  Filled 2013-10-31: qty 50

## 2013-10-31 MED ORDER — BUPIVACAINE HCL (PF) 0.5 % IJ SOLN
INTRAMUSCULAR | Status: DC | PRN
  Administered 2013-10-31: 8 mL

## 2013-10-31 MED ORDER — MIDAZOLAM HCL 2 MG/2ML IJ SOLN
INTRAMUSCULAR | Status: AC
  Filled 2013-10-31: qty 2

## 2013-10-31 MED ORDER — LIDOCAINE HCL (PF) 1 % IJ SOLN
INTRAMUSCULAR | Status: AC
  Filled 2013-10-31: qty 5

## 2013-10-31 MED ORDER — BUPIVACAINE HCL (PF) 0.5 % IJ SOLN
INTRAMUSCULAR | Status: AC
  Filled 2013-10-31: qty 30

## 2013-10-31 MED ORDER — GLYCOPYRROLATE 0.2 MG/ML IJ SOLN
0.2000 mg | Freq: Once | INTRAMUSCULAR | Status: AC
  Administered 2013-10-31: 0.2 mg via INTRAVENOUS

## 2013-10-31 MED ORDER — FENTANYL CITRATE 0.05 MG/ML IJ SOLN
25.0000 ug | INTRAMUSCULAR | Status: DC | PRN
  Administered 2013-10-31 (×2): 50 ug via INTRAVENOUS

## 2013-10-31 MED ORDER — CHLORHEXIDINE GLUCONATE 4 % EX LIQD: 1.0000 "application " | Freq: Once | CUTANEOUS | Status: DC

## 2013-10-31 MED ORDER — LACTATED RINGERS IV SOLN
INTRAVENOUS | Status: DC
  Administered 2013-10-31: 07:00:00 via INTRAVENOUS

## 2013-10-31 MED ORDER — LIDOCAINE HCL 1 % IJ SOLN
INTRAMUSCULAR | Status: DC | PRN
  Administered 2013-10-31: 40 mg via INTRADERMAL

## 2013-10-31 MED ORDER — PROPOFOL 10 MG/ML IV BOLUS
INTRAVENOUS | Status: DC | PRN
  Administered 2013-10-31: 200 mg via INTRAVENOUS

## 2013-10-31 MED ORDER — FENTANYL CITRATE 0.05 MG/ML IJ SOLN
INTRAMUSCULAR | Status: DC | PRN
  Administered 2013-10-31 (×2): 25 ug via INTRAVENOUS
  Administered 2013-10-31: 50 ug via INTRAVENOUS

## 2013-10-31 MED ORDER — POVIDONE-IODINE 10 % OINT PACKET
TOPICAL_OINTMENT | CUTANEOUS | Status: DC | PRN
  Administered 2013-10-31: 1 via TOPICAL

## 2013-10-31 MED ORDER — KETOROLAC TROMETHAMINE 30 MG/ML IJ SOLN
30.0000 mg | Freq: Once | INTRAMUSCULAR | Status: AC
  Administered 2013-10-31: 30 mg via INTRAVENOUS
  Filled 2013-10-31: qty 1

## 2013-10-31 MED ORDER — ONDANSETRON HCL 4 MG/2ML IJ SOLN
INTRAMUSCULAR | Status: AC
  Filled 2013-10-31: qty 2

## 2013-10-31 SURGICAL SUPPLY — 33 items
BAG HAMPER (MISCELLANEOUS) ×2 IMPLANT
BLADE 11 SAFETY STRL DISP (BLADE) IMPLANT
CLOTH BEACON ORANGE TIMEOUT ST (SAFETY) ×2 IMPLANT
COVER LIGHT HANDLE STERIS (MISCELLANEOUS) ×4 IMPLANT
DECANTER SPIKE VIAL GLASS SM (MISCELLANEOUS) ×2 IMPLANT
DURAPREP 26ML APPLICATOR (WOUND CARE) ×2 IMPLANT
ELECT REM PT RETURN 9FT ADLT (ELECTROSURGICAL) ×2
ELECTRODE REM PT RTRN 9FT ADLT (ELECTROSURGICAL) ×1 IMPLANT
GLOVE BIO SURGEON STRL SZ 6.5 (GLOVE) ×4 IMPLANT
GLOVE BIOGEL PI IND STRL 7.0 (GLOVE) ×3 IMPLANT
GLOVE BIOGEL PI INDICATOR 7.0 (GLOVE) ×3
GLOVE SURG SS PI 7.5 STRL IVOR (GLOVE) ×4 IMPLANT
GOWN STRL REUS W/ TWL XL LVL3 (GOWN DISPOSABLE) ×1 IMPLANT
GOWN STRL REUS W/TWL LRG LVL3 (GOWN DISPOSABLE) ×8 IMPLANT
GOWN STRL REUS W/TWL XL LVL3 (GOWN DISPOSABLE) ×1
INST SET MINOR GENERAL (KITS) ×2 IMPLANT
KIT ROOM TURNOVER APOR (KITS) ×2 IMPLANT
MANIFOLD NEPTUNE II (INSTRUMENTS) ×2 IMPLANT
MESH VENTRALEX ST 1-7/10 CRC S (Mesh General) ×2 IMPLANT
NEEDLE HYPO 25X1 1.5 SAFETY (NEEDLE) ×2 IMPLANT
NS IRRIG 1000ML POUR BTL (IV SOLUTION) ×2 IMPLANT
PACK MINOR (CUSTOM PROCEDURE TRAY) ×2 IMPLANT
PAD ARMBOARD 7.5X6 YLW CONV (MISCELLANEOUS) ×2 IMPLANT
SET BASIN LINEN APH (SET/KITS/TRAYS/PACK) ×2 IMPLANT
SPONGE GAUZE 2X2 8PLY STRL LF (GAUZE/BANDAGES/DRESSINGS) ×2 IMPLANT
STAPLER VISISTAT (STAPLE) ×2 IMPLANT
SUT ETHIBOND NAB MO 7 #0 18IN (SUTURE) ×2 IMPLANT
SUT VIC AB 2-0 CT2 27 (SUTURE) ×2 IMPLANT
SUT VIC AB 3-0 SH 27 (SUTURE) ×1
SUT VIC AB 3-0 SH 27X BRD (SUTURE) ×1 IMPLANT
SUT VICRYL AB 3 0 TIES (SUTURE) IMPLANT
SYRINGE CONTROL L 12CC (SYRINGE) ×2 IMPLANT
TAPE CLOTH SURG 4X10 WHT LF (GAUZE/BANDAGES/DRESSINGS) ×2 IMPLANT

## 2013-10-31 NOTE — Interval H&P Note (Signed)
History and Physical Interval Note:  10/31/2013 7:27 AM  Maurice Richards  has presented today for surgery, with the diagnosis of umbilical hernia   The various methods of treatment have been discussed with the patient and family. After consideration of risks, benefits and other options for treatment, the patient has consented to  Procedure(s): HERNIA REPAIR UMBILICAL ADULT WITH MESH (N/A) as a surgical intervention .  The patient's history has been reviewed, patient examined, no change in status, stable for surgery.  I have reviewed the patient's chart and labs.  Questions were answered to the patient's satisfaction.     Aviva Signs A

## 2013-10-31 NOTE — Discharge Instructions (Signed)
Umbilical Herniorrhaphy, Care After °Refer to this sheet in the next few weeks. These instructions provide you with information on caring for yourself after your procedure. Your health care provider may also give you more specific instructions. Your treatment has been planned according to current medical practices, but problems sometimes occur. Call your health care provider if you have any problems or questions after your procedure. °HOME CARE INSTRUCTIONS °· If you are given antibiotic medicine, take it as directed. Finish it even if you start to feel better. °· Only take over-the-counter or prescription medicines for pain, fever, or discomfort as directed by your health care provider. Do not take aspirin. It can cause bleeding. °· Do not get your surgical cut (incision) area wet unless your health care provider says it is okay. °· Avoid lifting objects heavier than 10 lb (4.5 kg) for 8 weeks after surgery. °· Avoid sexual activity for 5 weeks after surgery or as directed by your health care provider. °· Do not drive while taking prescription pain medicine. °· You may return to your other normal, daily activities after 3 days or as directed by your health care provider. °SEEK MEDICAL CARE IF: °· You notice blood or fluid leaking from the surgical site. °· Your incision area becomes red or swollen. °· Your pain at the surgical site becomes worse or is not relieved by medicine. °· You have problems urinating. °· You feel nauseous or vomit more than 2 days after surgery. °· You notice the bulge in your abdomen returns after the procedure. °SEEK IMMEDIATE MEDICAL CARE IF: °· You have a fever. °· You have nausea or vomiting that will not stop. °Document Released: 07/20/2011 Document Revised: 06/04/2013 Document Reviewed: 07/20/2011 °[employer]® Patient Information ©2015 [employer]. This information is not intended to replace advice given to you by your health care provider. Make sure you discuss any questions you have  with your health care provider. ° °

## 2013-10-31 NOTE — Anesthesia Postprocedure Evaluation (Signed)
  Anesthesia Post-op Note  Patient: Maurice Richards  Procedure(s) Performed: Procedure(s) with comments: UMBILICAL HERNIORRHAPHY WITH MESH (N/A) INSERTION OF MESH (N/A) - Umbilical  Patient Location: PACU  Anesthesia Type:General  Level of Consciousness: sedated, arousable  Airway and Oxygen Therapy: Patient Spontanous Breathing and Patient connected to face mask oxygen  Post-op Pain: none  Post-op Assessment: Post-op Vital signs reviewed, Patient's Cardiovascular Status Stable, Respiratory Function Stable, Patent Airway and No signs of Nausea or vomiting  Post-op Vital Signs: Reviewed and stable  Last Vitals:  Filed Vitals:   10/31/13 0730  BP: 115/80  Pulse:   Temp:   Resp: 23    Complications: No apparent anesthesia complications

## 2013-10-31 NOTE — Interval H&P Note (Signed)
History and Physical Interval Note:  10/31/2013 7:26 AM  Maurice Richards  has presented today for surgery, with the diagnosis of umbilical hernia   The various methods of treatment have been discussed with the patient and family. After consideration of risks, benefits and other options for treatment, the patient has consented to  Procedure(s): HERNIA REPAIR UMBILICAL ADULT WITH MESH (N/A) as a surgical intervention .  The patient's history has been reviewed, patient examined, no change in status, stable for surgery.  I have reviewed the patient's chart and labs.  Questions were answered to the patient's satisfaction.     Aviva Signs A

## 2013-10-31 NOTE — Anesthesia Preprocedure Evaluation (Signed)
Anesthesia Evaluation  Patient identified by MRN, date of birth, ID band Patient awake    Reviewed: Allergy & Precautions, H&P , NPO status , Patient's Chart, lab work & pertinent test results  Airway Mallampati: II TM Distance: >3 FB Neck ROM: Limited    Dental  (+) Teeth Intact   Pulmonary former smoker,  breath sounds clear to auscultation        Cardiovascular negative cardio ROS  Rhythm:Regular Rate:Normal     Neuro/Psych  Headaches, PSYCHIATRIC DISORDERS Anxiety Cervical disc    GI/Hepatic negative GI ROS, (+) Hepatitis -, C  Endo/Other    Renal/GU      Musculoskeletal   Abdominal   Peds  Hematology   Anesthesia Other Findings   Reproductive/Obstetrics                           Anesthesia Physical Anesthesia Plan  ASA: II  Anesthesia Plan: General   Post-op Pain Management:    Induction: Intravenous  Airway Management Planned: LMA  Additional Equipment:   Intra-op Plan:   Post-operative Plan: Extubation in OR  Informed Consent: I have reviewed the patients History and Physical, chart, labs and discussed the procedure including the risks, benefits and alternatives for the proposed anesthesia with the patient or authorized representative who has indicated his/her understanding and acceptance.     Plan Discussed with:   Anesthesia Plan Comments:         Anesthesia Quick Evaluation

## 2013-10-31 NOTE — Op Note (Signed)
Patient:  Maurice Richards  DOB:  Aug 06, 1955  MRN:  [id number]   Preop Diagnosis:  Umbilical hernia  Postop Diagnosis:  Same  Procedure:  Umbilical herniorrhaphy with mesh  Surgeon:  Aviva Signs, M.D.  Anes:  General  Indications:  Patient is a 58 year old white male who presents with a symptomatic umbilical hernia. The risks and benefits of the procedure including bleeding, infection, and recurrence of the hernia were fully explained to the patient, who gave informed consent.  Procedure note:  The patient is placed the supine position. After general anesthesia was administered, the abdomen was prepped and draped using usual sterile technique with DuraPrep. Surgical site confirmation was performed.  An infraumbilical incision was made down to the fascia. The umbilicus was freed away from the underlying fascia. The omentum was noted within the hernia sac. This was freed away and reduced without difficulty. The hernia sac was excised. The defect was approximately 1.5 cm in its greatest diameter. A 4.3 cm ventralax ST patch was then inserted and secured to the fascia using 0 Ethibond interrupted sutures. The overlying fascia was reapproximated using 0 Ethibond interrupted sutures. The umbilicus was secured back to the fascia using a 2-0 Vicryl interrupted suture. The subcutaneous layer was reapproximated using 3-0 Vicryl interrupted suture. The skin was closed using staples. 0.5% Sensorcaine was instilled the surrounding wound. Betadine ointment and dry sterile dressings were applied.  All tape and needle counts were correct at the end of the procedure. Patient was awakened and transferred to PACU in stable condition.  Complications:  None  EBL:  Minimal  Specimen:  None

## 2013-10-31 NOTE — Transfer of Care (Signed)
Immediate Anesthesia Transfer of Care Note  Patient: Maurice Richards  Procedure(s) Performed: Procedure(s) with comments: UMBILICAL HERNIORRHAPHY WITH MESH (N/A) INSERTION OF MESH (N/A) - Umbilical  Patient Location: PACU  Anesthesia Type:General  Level of Consciousness: sedated  Airway & Oxygen Therapy: Patient Spontanous Breathing and Patient connected to face mask oxygen  Post-op Assessment: Report given to PACU RN  Post vital signs: Reviewed and stable  Complications: No apparent anesthesia complications

## 2013-10-31 NOTE — Anesthesia Procedure Notes (Signed)
Procedure Name: LMA Insertion Date/Time: 10/31/2013 7:44 AM Performed by: Tressie Stalker E Pre-anesthesia Checklist: Patient identified, Patient being monitored, Emergency Drugs available, Timeout performed and Suction available Patient Re-evaluated:Patient Re-evaluated prior to inductionOxygen Delivery Method: Circle System Utilized Preoxygenation: Pre-oxygenation with 100% oxygen Intubation Type: IV induction Ventilation: Mask ventilation without difficulty LMA: LMA inserted LMA Size: 4.0 and 5.0 Number of attempts: 1 Placement Confirmation: positive ETCO2 and breath sounds checked- equal and bilateral Comments: #4 LMA placed, not a good seal, #5 placed, air leak at ~20cc

## 2014-03-21 ENCOUNTER — Encounter: Payer: Self-pay | Admitting: Podiatry

## 2014-03-21 ENCOUNTER — Ambulatory Visit (INDEPENDENT_AMBULATORY_CARE_PROVIDER_SITE_OTHER): Payer: BLUE CROSS/BLUE SHIELD

## 2014-03-21 ENCOUNTER — Ambulatory Visit (INDEPENDENT_AMBULATORY_CARE_PROVIDER_SITE_OTHER): Payer: BLUE CROSS/BLUE SHIELD | Admitting: Podiatry

## 2014-03-21 VITALS — BP 147/87 | HR 70 | Resp 16

## 2014-03-21 DIAGNOSIS — M722 Plantar fascial fibromatosis: Secondary | ICD-10-CM | POA: Diagnosis not present

## 2014-03-21 DIAGNOSIS — M779 Enthesopathy, unspecified: Secondary | ICD-10-CM | POA: Diagnosis not present

## 2014-03-21 MED ORDER — DICLOFENAC SODIUM 75 MG PO TBEC: 75.0000 mg | DELAYED_RELEASE_TABLET | Freq: Two times a day (BID) | ORAL | Status: DC

## 2014-03-21 MED ORDER — TRIAMCINOLONE ACETONIDE 10 MG/ML IJ SUSP
10.0000 mg | Freq: Once | INTRAMUSCULAR | Status: AC
  Administered 2014-03-21: 10 mg

## 2014-03-21 NOTE — Progress Notes (Signed)
Subjective:     Patient ID: Maurice Richards, male   DOB: 1956/01/20, 59 y.o.   MRN: [id number]  HPI patient states that both of his heels have been bothering him for about 6 months and that he stands on concrete all day. States it's worse in the morning and after periods of sitting has tried different shoe gear and ice therapy   Review of Systems  All other systems reviewed and are negative.      Objective:   Physical Exam  Constitutional: He is oriented to person, place, and time.  Cardiovascular: Intact distal pulses.   Musculoskeletal: Normal range of motion.  Neurological: He is oriented to person, place, and time.  Skin: Skin is warm.  Nursing note and vitals reviewed.  neurovascular status found to be intact with muscle strength adequate range of motion subtalar midtarsal joint within normal limits. Patient's noted to have moderate to severe discomfort plantar aspect of the heel bilateral with fluid buildup noted and moderate depression of the arch. 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 Discussed long-term orthotics for chronic control which will decide on that visit and placed on diclofenac 75 mg twice a day and gave instructions on physical therapy

## 2014-03-21 NOTE — Progress Notes (Signed)
   Subjective:    Patient ID: Maurice Richards, male    DOB: [birth year], 59 y.o.   MRN: [id number]  HPI Comments: "I have pain in the bottoms of my feet"  Patient c/o aching plantar heel and forefoot bilateral, left over right, for several months. Stood on concrete working for several years. AM pain. Tried new supportive shoes.   Foot Pain      Review of Systems  Musculoskeletal: Positive for gait problem.  All other systems reviewed and are negative.      Objective:   Physical Exam        Assessment & Plan:

## 2014-03-21 NOTE — Patient Instructions (Signed)

## 2014-04-01 ENCOUNTER — Ambulatory Visit (INDEPENDENT_AMBULATORY_CARE_PROVIDER_SITE_OTHER): Payer: BLUE CROSS/BLUE SHIELD | Admitting: Podiatry

## 2014-04-01 ENCOUNTER — Encounter: Payer: Self-pay | Admitting: Podiatry

## 2014-04-01 VITALS — BP 122/82 | HR 66 | Resp 12

## 2014-04-01 DIAGNOSIS — M722 Plantar fascial fibromatosis: Secondary | ICD-10-CM | POA: Diagnosis not present

## 2014-04-01 MED ORDER — PREDNISONE 10 MG PO TABS: ORAL_TABLET | ORAL | Status: DC

## 2014-04-01 MED ORDER — TRIAMCINOLONE ACETONIDE 10 MG/ML IJ SUSP
10.0000 mg | Freq: Once | INTRAMUSCULAR | Status: AC
  Administered 2014-04-01: 10 mg

## 2014-04-02 NOTE — Progress Notes (Signed)
Subjective:     Patient ID: Maurice Richards, male   DOB: [birth year], 59 y.o.   MRN: [id number]  HPI patient Richards that his heels are still sore and that he is going to [place]  on Thursday and is hopeful that he'll be able to walk   Review of Systems     Objective:   Physical Exam Neurovascular status intact with muscle strength adequate range of motion within normal limits. Patient continues to exhibit quite a bit of discomfort in the plantar aspect of the heel both feet with moderate improvement from previous visit but still sore when pressed    Assessment:     Plantar fasciitis left and right with fluid buildup still noted at the insertion    Plan:     H&P and condition discussed with patient. At this time I reinjected the plantar fascia bilateral 3 mg Kenalog 5 mg Xylocaine and scanned for custom orthotics to reduce stress against his heels. Reappoint to recheck

## 2014-04-22 ENCOUNTER — Ambulatory Visit (INDEPENDENT_AMBULATORY_CARE_PROVIDER_SITE_OTHER): Payer: BLUE CROSS/BLUE SHIELD | Admitting: Podiatry

## 2014-04-22 ENCOUNTER — Encounter: Payer: Self-pay | Admitting: Podiatry

## 2014-04-22 VITALS — BP 109/76 | HR 78

## 2014-04-22 DIAGNOSIS — M722 Plantar fascial fibromatosis: Secondary | ICD-10-CM | POA: Diagnosis not present

## 2014-04-22 DIAGNOSIS — M779 Enthesopathy, unspecified: Secondary | ICD-10-CM

## 2014-04-22 NOTE — Progress Notes (Signed)
Subjective:     Patient ID: Maurice Richards, male   DOB: [birth year], 59 y.o.   MRN: [id number]  HPI patient states that his heels did pretty well on his trip but his left one is bothering him quite a bit   Review of Systems     Objective:   Physical Exam Neurovascular status intact with discomfort plantar aspect left heel at the insertional point worse when getting up in the morning or after periods of sitting    Assessment:     Continued plantar fasciitis left over right    Plan:     Reviewed condition and dispensed orthotics with instructions on usage. Dispensed night splint with instructions on usage and wear throughout the entire night and reappoint 4 weeks earlier if any problems should occur

## 2014-04-22 NOTE — Patient Instructions (Signed)
WEARING INSTRUCTIONS FOR ORTHOTICS  Don't expect to be comfortable wearing your orthotic devices for the first time.  Like eyeglasses, you may be aware of them as time passes, they will not be uncomfortable and you will enjoy wearing them.  FOLLOW THESE INSTRUCTIONS EXACTLY!  Wear your orthotic devices for:       Not more than 1 hour the first day.       Not more than 2 hours the second day.       Not more than 3 hours the third day and so on.        Or wear them for as long as they feel comfortable.       If you experience discomfort in your feet or legs take them out.  When feet & legs feel       better, put them back in.  You do need to be consistent and wear them a little        everyday. 2.   If at any time the orthotic devices become acutely uncomfortable before the       time for that particular day, STOP WEARING THEM. 3.   On the next day, do not increase the wearing time. 4.   Subsequently, increase the wearing time by 15-30 minutes only if comfortable to do       so. 5.   You will be seen by your doctor about 2-4 weeks after you receive your orthotic       devices, at which time you will probably be wearing your devices comfortably        for about 8 hours or more a day. 6.   Some patients occasionally report mild aches or discomfort in other parts of the of       body such as the knees, hips or back after 3 or 4 consecutive hours of wear.  If this       is the case with you, do not extend your wearing time.  Instead, cut it back an hour or       two.  In all likelihood, these symptoms will disappear in a short period of time as your       body posture realigns itself and functions more efficiently. 7.   It is possible that your orthotic device may require some small changes or adjustment       to improve their function or make them more comfortable.   This is usually not done       before one to three months have elapsed.  These adjustments are made in        accordance with  the changed position your feet are assuming as a result of       improved biomechanical function. 8.   In women's shoes, it's not unusual for your heel to slip out of the shoe, particularly if       they are step-in-shoes.  If this is the case, try other shoes or other styles.  Try to       purchase shoes which have deeper heal seats or higher heel counters. 9.   Squeaking of orthotics devices in the shoes is due to the movement of the devices       when they are functioning normally.  To eliminate squeaking, simply dust some       baby powder into your shoes before inserting the devices.  If this does not work,          apply soap or wax to the edges of the orthotic devices or put a tissue into the shoes. 10. It is important that you follow these directions explicitly.  Failure to do so will simply       prolong the adjustment period or create problems which are easily avoided.  It makes       no difference if you are wearing your orthotic devices for only a few hours after        several months, so long as you are wearing them comfortably for those hours. 11. If you have any questions or complaints, contact our office.  We have no way of       knowing about your problems unless you tell us.  If we do not hear from you, we will       assume that you are proceeding well.  Plantar Fasciitis (Heel Spur Syndrome) with Rehab The plantar fascia is a fibrous, ligament-like, soft-tissue structure that spans the bottom of the foot. Plantar fasciitis is a condition that causes pain in the foot due to inflammation of the tissue. SYMPTOMS  Pain and tenderness on the underneath side of the foot. Pain that worsens with standing or walking. CAUSES  Plantar fasciitis is caused by irritation and injury to the plantar fascia on the underneath side of the foot. Common mechanisms of injury include: Direct trauma to bottom of the foot. Damage to a small nerve that runs under the foot where the main fascia  attaches to the heel bone. Stress placed on the plantar fascia due to bone spurs. RISK INCREASES WITH:  Activities that place stress on the plantar fascia (running, jumping, pivoting, or cutting). Poor strength and flexibility. Improperly fitted shoes. Tight calf muscles. Flat feet. Failure to warm-up properly before activity. Obesity. PREVENTION Warm up and stretch properly before activity. Allow for adequate recovery between workouts. Maintain physical fitness: Strength, flexibility, and endurance. Cardiovascular fitness. Maintain a health body weight. Avoid stress on the plantar fascia. Wear properly fitted shoes, including arch supports for individuals who have flat feet.  PROGNOSIS  If treated properly, then the symptoms of plantar fasciitis usually resolve without surgery. However, occasionally surgery is necessary.  RELATED COMPLICATIONS  Recurrent symptoms that may result in a chronic condition. Problems of the lower back that are caused by compensating for the injury, such as limping. Pain or weakness of the foot during push-off following surgery. Chronic inflammation, scarring, and partial or complete fascia tear, occurring more often from repeated injections.  TREATMENT  Treatment initially involves the use of ice and medication to help reduce pain and inflammation. The use of strengthening and stretching exercises may help reduce pain with activity, especially stretches of the Achilles tendon. These exercises may be performed at home or with a therapist. Your caregiver may recommend that you use heel cups of arch supports to help reduce stress on the plantar fascia. Occasionally, corticosteroid injections are given to reduce inflammation. If symptoms persist for greater than 6 months despite non-surgical (conservative), then surgery may be recommended.   MEDICATION  If pain medication is necessary, then nonsteroidal anti-inflammatory medications, such as aspirin and  ibuprofen, or other minor pain relievers, such as acetaminophen, are often recommended. Do not take pain medication within 7 days before surgery. Prescription pain relievers may be given if deemed necessary by your caregiver. Use only as directed and only as much as you need. Corticosteroid injections may be given by your caregiver. These injections should be reserved for the   most serious cases, because they may only be given a certain number of times.  HEAT AND COLD Cold treatment (icing) relieves pain and reduces inflammation. Cold treatment should be applied for 10 to 15 minutes every 2 to 3 hours for inflammation and pain and immediately after any activity that aggravates your symptoms. Use ice packs or massage the area with a piece of ice (ice massage). Heat treatment may be used prior to performing the stretching and strengthening activities prescribed by your caregiver, physical therapist, or athletic trainer. Use a heat pack or soak the injury in warm water.  SEEK IMMEDIATE MEDICAL CARE IF: Treatment seems to offer no benefit, or the condition worsens. Any medications produce adverse side effects.  EXERCISES- RANGE OF MOTION (ROM) AND STRETCHING EXERCISES - Plantar Fasciitis (Heel Spur Syndrome) These exercises may help you when beginning to rehabilitate your injury. Your symptoms may resolve with or without further involvement from your physician, physical therapist or athletic trainer. While completing these exercises, remember:  Restoring tissue flexibility helps normal motion to return to the joints. This allows healthier, less painful movement and activity. An effective stretch should be held for at least 30 seconds. A stretch should never be painful. You should only feel a gentle lengthening or release in the stretched tissue.  RANGE OF MOTION - Toe Extension, Flexion Sit with your right / left leg crossed over your opposite knee. Grasp your toes and gently pull them back toward  the top of your foot. You should feel a stretch on the bottom of your toes and/or foot. Hold this stretch for 10 seconds. Now, gently pull your toes toward the bottom of your foot. You should feel a stretch on the top of your toes and or foot. Hold this stretch for 10 seconds. Repeat  times. Complete this stretch 3 times per day.   RANGE OF MOTION - Ankle Dorsiflexion, Active Assisted Remove shoes and sit on a chair that is preferably not on a carpeted surface. Place right / left foot under knee. Extend your opposite leg for support. Keeping your heel down, slide your right / left foot back toward the chair until you feel a stretch at your ankle or calf. If you do not feel a stretch, slide your bottom forward to the edge of the chair, while still keeping your heel down. Hold this stretch for 10 seconds. Repeat 3 times. Complete this stretch 2 times per day.   STRETCH  Gastroc, Standing Place hands on wall. Extend right / left leg, keeping the front knee somewhat bent. Slightly point your toes inward on your back foot. Keeping your right / left heel on the floor and your knee straight, shift your weight toward the wall, not allowing your back to arch. You should feel a gentle stretch in the right / left calf. Hold this position for 10 seconds. Repeat 3 times. Complete this stretch 2 times per day.  STRETCH  Soleus, Standing Place hands on wall. Extend right / left leg, keeping the other knee somewhat bent. Slightly point your toes inward on your back foot. Keep your right / left heel on the floor, bend your back knee, and slightly shift your weight over the back leg so that you feel a gentle stretch deep in your back calf. Hold this position for 10 seconds. Repeat 3 times. Complete this stretch 2 times per day.  STRETCH  Gastrocsoleus, Standing  Note: This exercise can place a lot of stress on your foot and ankle. Please   complete this exercise only if specifically instructed by your  caregiver.  Place the ball of your right / left foot on a step, keeping your other foot firmly on the same step. Hold on to the wall or a rail for balance. Slowly lift your other foot, allowing your body weight to press your heel down over the edge of the step. You should feel a stretch in your right / left calf. Hold this position for 10 seconds. Repeat this exercise with a slight bend in your right / left knee. Repeat 3 times. Complete this stretch 2 times per day.   STRENGTHENING EXERCISES - Plantar Fasciitis (Heel Spur Syndrome)  These exercises may help you when beginning to rehabilitate your injury. They may resolve your symptoms with or without further involvement from your physician, physical therapist or athletic trainer. While completing these exercises, remember:  Muscles can gain both the endurance and the strength needed for everyday activities through controlled exercises. Complete these exercises as instructed by your physician, physical therapist or athletic trainer. Progress the resistance and repetitions only as guided.  STRENGTH - Towel Curls Sit in a chair positioned on a non-carpeted surface. Place your foot on a towel, keeping your heel on the floor. Pull the towel toward your heel by only curling your toes. Keep your heel on the floor. Repeat 3 times. Complete this exercise 2 times per day.  STRENGTH - Ankle Inversion Secure one end of a rubber exercise band/tubing to a fixed object (table, pole). Loop the other end around your foot just before your toes. Place your fists between your knees. This will focus your strengthening at your ankle. Slowly, pull your big toe up and in, making sure the band/tubing is positioned to resist the entire motion. Hold this position for 10 seconds. Have your muscles resist the band/tubing as it slowly pulls your foot back to the starting position. Repeat 3 times. Complete this exercises 2 times per day.  Document Released: 01/18/2005  Document Revised: 04/12/2011 Document Reviewed: 05/02/2008 [employer] Patient Information 2014 [employer].   

## 2014-05-20 ENCOUNTER — Ambulatory Visit (INDEPENDENT_AMBULATORY_CARE_PROVIDER_SITE_OTHER): Payer: BLUE CROSS/BLUE SHIELD | Admitting: Podiatry

## 2014-05-20 DIAGNOSIS — M779 Enthesopathy, unspecified: Secondary | ICD-10-CM | POA: Diagnosis not present

## 2014-05-20 DIAGNOSIS — M722 Plantar fascial fibromatosis: Secondary | ICD-10-CM

## 2014-05-21 NOTE — Progress Notes (Signed)
Subjective:     Patient ID: Maurice Richards, male   DOB: 1955/07/05, 59 y.o.   MRN: [id number]  HPI patient states I'm doing well with my left foot with significant diminishment of discomfort and reduced pain with palpation   Review of Systems     Objective:   Physical Exam Neurovascular status intact with well-healing left foot with inserts that are fitted well and minimal inflammation or edema with minimal pain when palpated    Assessment:     Improving from treatment of left arch of a chronic nature    Plan:     Advised on the importance of physical therapy anti-inflammatories and support. Patient is discharged and less needs to be seen

## 2014-12-12 ENCOUNTER — Other Ambulatory Visit (HOSPITAL_COMMUNITY): Payer: Self-pay | Admitting: Physician Assistant

## 2014-12-12 DIAGNOSIS — R1013 Epigastric pain: Secondary | ICD-10-CM

## 2014-12-13 ENCOUNTER — Ambulatory Visit (HOSPITAL_COMMUNITY)
Admission: RE | Admit: 2014-12-13 | Discharge: 2014-12-13 | Disposition: A | Discharge status: Home / Self Care | Payer: BLUE CROSS/BLUE SHIELD | Source: Ambulatory Visit | Attending: Physician Assistant | Admitting: Physician Assistant

## 2014-12-13 DIAGNOSIS — R1013 Epigastric pain: Secondary | ICD-10-CM | POA: Diagnosis not present

## 2014-12-16 ENCOUNTER — Other Ambulatory Visit (HOSPITAL_COMMUNITY): Payer: Self-pay

## 2014-12-31 ENCOUNTER — Ambulatory Visit (INDEPENDENT_AMBULATORY_CARE_PROVIDER_SITE_OTHER): Payer: BLUE CROSS/BLUE SHIELD | Admitting: Internal Medicine

## 2014-12-31 ENCOUNTER — Other Ambulatory Visit: Payer: Self-pay

## 2014-12-31 ENCOUNTER — Encounter: Payer: Self-pay | Admitting: Internal Medicine

## 2014-12-31 VITALS — BP 126/78 | HR 59 | Temp 98.1°F | Ht 72.0 in | Wt 232.0 lb

## 2014-12-31 DIAGNOSIS — Z86010 Personal history of colonic polyps: Secondary | ICD-10-CM | POA: Diagnosis not present

## 2014-12-31 DIAGNOSIS — K219 Gastro-esophageal reflux disease without esophagitis: Secondary | ICD-10-CM | POA: Insufficient documentation

## 2014-12-31 DIAGNOSIS — R1310 Dysphagia, unspecified: Secondary | ICD-10-CM | POA: Diagnosis not present

## 2014-12-31 MED ORDER — PEG 3350-KCL-NA BICARB-NACL 420 G PO SOLR: 4000.0000 mL | Freq: Once | ORAL | Status: DC

## 2014-12-31 NOTE — Progress Notes (Signed)
Primary Care Physician:  Purvis Kilts, MD Primary Gastroenterologist:  Dr. Gala Romney  Pre-Procedure History & Physical: HPI:  Maurice Richards is a 59 y.o. male here for consideration of a follow-up colonoscopy; has paper hematochezia. Describes protruding hemorrhoids for which he saw Dr. Romona Curls and underwent ultrasound treatment x3 or 4. Some improvement in protrusion and bleeding. History of colonic adenoma removed early in 2009. He is due for surveillance at this time. Long-standing GERD in the setting of a 20-30 pound weight gain. Intermittent esophageal dysphagia. Rare alcohol consumption.  Was told by PCP had a bump in his liver enzymes but I do not have a copy of the reports to review.  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 Fatty-appearing liver on recent ultrasound  Past Medical History  Diagnosis Date  . Hepatitis     hep C  . Cervical disc herniation   . Headache(784.0)     from cervical disc  . Anxiety   . Tubular adenoma     Past Surgical History  Procedure Laterality Date  . Knee arthroscopy Bilateral   . Rotator cuff repair Bilateral   . Tonsillectomy    . Umbilical hernia repair N/A 10/31/2013    Procedure: UMBILICAL HERNIORRHAPHY WITH MESH;  Surgeon: Jamesetta So, MD;  Location: [facility];  Service: General;  Laterality: N/A;  . Insertion of mesh N/A 10/31/2013    Procedure: INSERTION OF MESH;  Surgeon: Jamesetta So, MD;  Location: [facility];  Service: General;  Laterality: N/A;  Umbilical  . Colonoscopy  02/24/2007    Dr.Ariv Penrod- friable anal canal hemorrhoids o/w normal rectum, diminutive mid descending colon polyps, a couple of submucosal petechiae on a couple folds in the L colon remainder of colonic mucosa appeared normal bx=tubular adenoma    Prior to Admission medications   Medication Sig Start Date End Date Taking? Authorizing Provider  diclofenac (VOLTAREN) 75 MG EC tablet Take 1 tablet (75 mg total) by  mouth 2 (two) times daily. 03/21/14  Yes Tamala Fothergill Regal, DPM  montelukast (SINGULAIR) 10 MG tablet Take 10 mg by mouth at bedtime.   Yes Historical Provider, MD  oxyCODONE-acetaminophen (PERCOCET) 7.5-325 MG per tablet Take 1-2 tablets by mouth every 4 (four) hours as needed. 10/31/13  Yes Aviva Signs, MD  acetaminophen (TYLENOL) 500 MG tablet Take 500 mg by mouth every 6 (six) hours as needed.    Historical Provider, MD  ALPRAZolam Duanne Moron) 1 MG tablet Take 1 mg by mouth 3 (three) times daily as needed for anxiety.    Historical Provider, MD  predniSONE (DELTASONE) 10 MG tablet 12 day tapering dose Patient not taking: Reported on 12/31/2014 04/01/14   Wallene Huh, DPM    Allergies as of 12/31/2014  . (No Known Allergies)    No family history on file.  Social History   Social History  . Marital Status: Divorced    Spouse Name: N/A  . Number of Children: N/A  . Years of Education: N/A   Occupational History  . Not on file.   Social History Main Topics  . Smoking status: Former Smoker -- 1.50 packs/day for 10 years    Types: Cigarettes  . Smokeless tobacco: Former Systems developer    Quit date: 10/22/1996  . Alcohol Use: Yes     Comment: seldom  . Drug Use: No  . Sexual Activity: Yes    Birth Control/ Protection: None   Other Topics Concern  .  Not on file   Social History Narrative    Review of Systems: See HPI, otherwise negative ROS  Physical Exam: BP 126/78 mmHg  Pulse 59  Temp(Src) 98.1 F (36.7 C) (Oral)  Ht 6' (1.829 m)  Wt 232 lb (105.235 kg)  BMI 31.46 kg/m2 General:   Alert,  Well-developed, well-nourished, pleasant and cooperative in NAD Skin:  Intact without significant lesions or rashes. Eyes:  Sclera clear, no icterus.   Conjunctiva pink. Neck:  Supple; no masses or thyromegaly. No significant cervical adenopathy. Lungs:  Clear throughout to auscultation.   No wheezes, crackles, or rhonchi. No acute distress. Heart:  Regular rate and rhythm; no murmurs,  clicks, rubs,  or gallops. Abdomen: Non-distended, normal bowel sounds.  Soft and nontender without appreciable mass or hepatosplenomegaly.  Pulses:  Normal pulses noted. Extremities:  Without clubbing or edema.  Impression:  Pleasant 59 year old gentleman history of a colonic adenoma;  due for surveillance examination at this time;paper hematochezia. History of symptomatic hemorrhoids  -  status post topical ultrasound therapy elsewhere. Long-standing GERD with esophageal dysplasia in the setting of a 20-30 pound weight gain over the past 3-4 years.  No prior EGD History of hepatitis C genotype 2 status post eradication many years ago with combination ribavirin and interferon therapy.  History of elevated liver enzymes without further information available at this time. Fatty liver on recent ultrasound.   Recommendations:     Schedule diagnostic colonoscopy (hematochezia) also, a hx of polyps and EGD with possible dilation (GERD and dysphagia).  The risks, benefits, limitations, imponderables and alternatives regarding both EGD and colonoscopy have been reviewed with the patient. Questions have been answered. All parties agreeable.  Split prep / Propofol. Will retrieve labs from [facility] for review.. Further recommendations to follow.      Notice: This dictation was prepared with Dragon dictation along with smaller phrase technology. Any transcriptional errors that result from this process are unintentional and may not be corrected upon review.

## 2014-12-31 NOTE — Patient Instructions (Signed)
Schedule diagnostic colonoscopy (hematochezia) also, a hx of polyps and EGD with possible dilation (GERD and dysphagia)  Split prep / Propofol  Further recommendations to follow.

## 2015-01-01 NOTE — Patient Instructions (Signed)
3    Your procedure is scheduled on: 01/06/2015  Report to [facility] at  10:30   AM.  Call this number if you have problems the morning of surgery: [phone]   Remember:   Do not drink or eat food:After Midnight.    Clear liquids include soda, tea, black coffee, apple or grape juice, broth.  Take these medicines the morning of surgery with A SIP OF WATER: Xanax and Singulair   Do not wear jewelry, make-up or nail polish.  Do not wear lotions, powders, or perfumes. You may wear deodorant.  Do not shave 48 hours prior to surgery. Men may shave face and neck.  Do not bring valuables to the hospital.  Contacts, dentures or bridgework may not be worn into surgery.  Leave suitcase in the car. After surgery it may be brought to your room.  For patients admitted to the hospital, checkout time is 11:00 AM the day of discharge.   Patients discharged the day of surgery will not be allowed to drive home.  Name and phone number of your driver:    Please read over the following fact sheets that you were given: Pain Booklet, Lab Information and Anesthesia Post-op Instructions   Endoscopy Care After Please read the instructions outlined below and refer to this sheet in the next few weeks. These discharge instructions provide you with general information on caring for yourself after you leave the hospital. Your doctor may also give you specific instructions. While your treatment has been planned according to the most current medical practices available, unavoidable complications occasionally occur. If you have any problems or questions after discharge, please call your doctor. HOME CARE INSTRUCTIONS Activity  You may resume your regular activity but move at a slower pace for the next 24 hours.   Take frequent rest periods for the next 24 hours.   Walking will help expel (get rid of) the air and reduce the bloated feeling in your abdomen.   No driving for 24 hours (because of the anesthesia  (medicine) used during the test).   You may shower.   Do not sign any important legal documents or operate any machinery for 24 hours (because of the anesthesia used during the test).  Nutrition  Drink plenty of fluids.   You may resume your normal diet.   Begin with a light meal and progress to your normal diet.   Avoid alcoholic beverages for 24 hours or as instructed by your caregiver.  Medications You may resume your normal medications unless your caregiver tells you otherwise. What you can expect today  You may experience abdominal discomfort such as a feeling of fullness or "gas" pains.   You may experience a sore throat for 2 to 3 days. This is normal. Gargling with salt water may help this.  Follow-up Your doctor will discuss the results of your test with you. SEEK IMMEDIATE MEDICAL CARE IF:  You have excessive nausea (feeling sick to your stomach) and/or vomiting.   You have severe abdominal pain and distention (swelling).   You have trouble swallowing.   You have a temperature over 100 F (37.8 C).   You have rectal bleeding or vomiting of blood.  Document Released: 09/02/2003 Document Revised: 01/07/2011 Document Reviewed: 03/15/2007 Colonoscopy, Care After Refer to this sheet in the next few weeks. These instructions provide you with information on caring for yourself after your procedure. Your health care provider may also give you more specific instructions. Your treatment has been  planned according to current medical practices, but problems sometimes occur. Call your health care provider if you have any problems or questions after your procedure. WHAT TO EXPECT AFTER THE PROCEDURE  After your procedure, it is typical to have the following:  A small amount of blood in your stool.  Moderate amounts of gas and mild abdominal cramping or bloating. HOME CARE INSTRUCTIONS  Do not drive, operate machinery, or sign important documents for 24 hours.  You may  shower and resume your regular physical activities, but move at a slower pace for the first 24 hours.  Take frequent rest periods for the first 24 hours.  Walk around or put a warm pack on your abdomen to help reduce abdominal cramping and bloating.  Drink enough fluids to keep your urine clear or pale yellow.  You may resume your normal diet as instructed by your health care provider. Avoid heavy or fried foods that are hard to digest.  Avoid drinking alcohol for 24 hours or as instructed by your health care provider.  Only take over-the-counter or prescription medicines as directed by your health care provider.  If a tissue sample (biopsy) was taken during your procedure:  Do not take aspirin or blood thinners for 7 days, or as instructed by your health care provider.  Do not drink alcohol for 7 days, or as instructed by your health care provider.  Eat soft foods for the first 24 hours. SEEK MEDICAL CARE IF: You have persistent spotting of blood in your stool 2-3 days after the procedure. SEEK IMMEDIATE MEDICAL CARE IF:  You have more than a small spotting of blood in your stool.  You pass large blood clots in your stool.  Your abdomen is swollen (distended).  You have nausea or vomiting.  You have a fever.  You have increasing abdominal pain that is not relieved with medicine.   This information is not intended to replace advice given to you by your health care provider. Make sure you discuss any questions you have with your health care provider.   Document Released: 09/02/2003 Document Revised: 11/08/2012 Document Reviewed: 09/25/2012 Elsevier Interactive Patient Education [employer].

## 2015-01-02 ENCOUNTER — Other Ambulatory Visit: Payer: Self-pay

## 2015-01-02 ENCOUNTER — Encounter (HOSPITAL_COMMUNITY)
Admission: RE | Admit: 2015-01-02 | Discharge: 2015-01-02 | Disposition: A | Discharge status: Home / Self Care | Payer: BLUE CROSS/BLUE SHIELD | Source: Ambulatory Visit | Attending: Internal Medicine | Admitting: Internal Medicine

## 2015-01-02 ENCOUNTER — Encounter (HOSPITAL_COMMUNITY): Payer: Self-pay

## 2015-01-02 ENCOUNTER — Other Ambulatory Visit (HOSPITAL_COMMUNITY): Payer: BLUE CROSS/BLUE SHIELD

## 2015-01-02 DIAGNOSIS — K921 Melena: Secondary | ICD-10-CM | POA: Insufficient documentation

## 2015-01-02 DIAGNOSIS — R1310 Dysphagia, unspecified: Secondary | ICD-10-CM | POA: Diagnosis not present

## 2015-01-02 DIAGNOSIS — K625 Hemorrhage of anus and rectum: Secondary | ICD-10-CM | POA: Diagnosis not present

## 2015-01-02 DIAGNOSIS — K219 Gastro-esophageal reflux disease without esophagitis: Secondary | ICD-10-CM | POA: Insufficient documentation

## 2015-01-02 DIAGNOSIS — Z01818 Encounter for other preprocedural examination: Secondary | ICD-10-CM | POA: Insufficient documentation

## 2015-01-02 HISTORY — DX: Plantar fascial fibromatosis: M72.2

## 2015-01-02 LAB — CBC
HCT: 44.6 % (ref 39.0–52.0)
Hemoglobin: 15 g/dL (ref 13.0–17.0)
MCH: 30.7 pg (ref 26.0–34.0)
MCHC: 33.6 g/dL (ref 30.0–36.0)
MCV: 91.2 fL (ref 78.0–100.0)
Platelets: 247 10*3/uL (ref 150–400)
RBC: 4.89 MIL/uL (ref 4.22–5.81)
RDW: 12.3 % (ref 11.5–15.5)
WBC: 6.7 10*3/uL (ref 4.0–10.5)

## 2015-01-02 LAB — BASIC METABOLIC PANEL
Anion gap: 9 (ref 5–15)
BUN: 19 mg/dL (ref 6–20)
CO2: 28 mmol/L (ref 22–32)
Calcium: 9.3 mg/dL (ref 8.9–10.3)
Chloride: 102 mmol/L (ref 101–111)
Creatinine, Ser: 1.07 mg/dL (ref 0.61–1.24)
GFR calc Af Amer: >60 mL/min (ref >60)
GFR calc non Af Amer: >60 mL/min (ref >60)
Glucose, Bld: 95 mg/dL (ref 65–99)
Potassium: 4.4 mmol/L (ref 3.5–5.1)
Sodium: 139 mmol/L (ref 135–145)

## 2015-01-02 NOTE — Pre-Procedure Instructions (Signed)
Patient given information to sign up for my chart at home. 

## 2015-01-06 ENCOUNTER — Encounter (HOSPITAL_COMMUNITY): Payer: Self-pay | Admitting: *Deleted

## 2015-01-06 ENCOUNTER — Ambulatory Visit (HOSPITAL_COMMUNITY): Payer: BLUE CROSS/BLUE SHIELD | Admitting: Anesthesiology

## 2015-01-06 ENCOUNTER — Encounter (HOSPITAL_COMMUNITY)
Admission: RE | Disposition: A | Discharge status: Home / Self Care | Payer: Self-pay | Source: Ambulatory Visit | Attending: Internal Medicine

## 2015-01-06 ENCOUNTER — Ambulatory Visit (HOSPITAL_COMMUNITY)
Admission: RE | Admit: 2015-01-06 | Discharge: 2015-01-06 | Disposition: A | Discharge status: Home / Self Care | Payer: BLUE CROSS/BLUE SHIELD | Source: Ambulatory Visit | Attending: Internal Medicine | Admitting: Internal Medicine

## 2015-01-06 DIAGNOSIS — K2100 Gastro-esophageal reflux disease with esophagitis, without bleeding: Secondary | ICD-10-CM | POA: Insufficient documentation

## 2015-01-06 DIAGNOSIS — K649 Unspecified hemorrhoids: Secondary | ICD-10-CM | POA: Diagnosis not present

## 2015-01-06 DIAGNOSIS — K921 Melena: Secondary | ICD-10-CM | POA: Diagnosis not present

## 2015-01-06 DIAGNOSIS — Z791 Long term (current) use of non-steroidal anti-inflammatories (NSAID): Secondary | ICD-10-CM | POA: Insufficient documentation

## 2015-01-06 DIAGNOSIS — K642 Third degree hemorrhoids: Secondary | ICD-10-CM | POA: Diagnosis not present

## 2015-01-06 DIAGNOSIS — R1310 Dysphagia, unspecified: Secondary | ICD-10-CM | POA: Insufficient documentation

## 2015-01-06 DIAGNOSIS — K449 Diaphragmatic hernia without obstruction or gangrene: Secondary | ICD-10-CM | POA: Insufficient documentation

## 2015-01-06 DIAGNOSIS — Z87891 Personal history of nicotine dependence: Secondary | ICD-10-CM | POA: Diagnosis not present

## 2015-01-06 DIAGNOSIS — B1920 Unspecified viral hepatitis C without hepatic coma: Secondary | ICD-10-CM | POA: Diagnosis not present

## 2015-01-06 DIAGNOSIS — Z86010 Personal history of colonic polyps: Secondary | ICD-10-CM | POA: Insufficient documentation

## 2015-01-06 DIAGNOSIS — R635 Abnormal weight gain: Secondary | ICD-10-CM | POA: Diagnosis not present

## 2015-01-06 DIAGNOSIS — Z79899 Other long term (current) drug therapy: Secondary | ICD-10-CM | POA: Insufficient documentation

## 2015-01-06 DIAGNOSIS — F419 Anxiety disorder, unspecified: Secondary | ICD-10-CM | POA: Diagnosis not present

## 2015-01-06 DIAGNOSIS — K219 Gastro-esophageal reflux disease without esophagitis: Secondary | ICD-10-CM | POA: Diagnosis present

## 2015-01-06 HISTORY — PX: ESOPHAGOGASTRODUODENOSCOPY (EGD) WITH PROPOFOL: SHX5813

## 2015-01-06 HISTORY — PX: COLONOSCOPY WITH PROPOFOL: SHX5780

## 2015-01-06 HISTORY — PX: MALONEY DILATION: SHX5535

## 2015-01-06 SURGERY — ESOPHAGOGASTRODUODENOSCOPY (EGD) WITH PROPOFOL
Anesthesia: Monitor Anesthesia Care

## 2015-01-06 MED ORDER — MIDAZOLAM HCL 2 MG/2ML IJ SOLN
1.0000 mg | INTRAMUSCULAR | Status: DC | PRN
  Administered 2015-01-06: 2 mg via INTRAVENOUS

## 2015-01-06 MED ORDER — ONDANSETRON HCL 4 MG/2ML IJ SOLN
4.0000 mg | Freq: Once | INTRAMUSCULAR | Status: AC
  Administered 2015-01-06: 4 mg via INTRAVENOUS

## 2015-01-06 MED ORDER — GLYCOPYRROLATE 0.2 MG/ML IJ SOLN
INTRAMUSCULAR | Status: AC
  Filled 2015-01-06: qty 1

## 2015-01-06 MED ORDER — LACTATED RINGERS IV SOLN
INTRAVENOUS | Status: DC
  Administered 2015-01-06: 1000 mL via INTRAVENOUS

## 2015-01-06 MED ORDER — LIDOCAINE VISCOUS 2 % MT SOLN
OROMUCOSAL | Status: AC
  Filled 2015-01-06: qty 15

## 2015-01-06 MED ORDER — FENTANYL CITRATE (PF) 100 MCG/2ML IJ SOLN: 25.0000 ug | INTRAMUSCULAR | Status: DC | PRN

## 2015-01-06 MED ORDER — PROPOFOL 500 MG/50ML IV EMUL
INTRAVENOUS | Status: DC | PRN
  Administered 2015-01-06 (×2): 150 ug/kg/min via INTRAVENOUS
  Administered 2015-01-06: 100 ug/kg/min via INTRAVENOUS

## 2015-01-06 MED ORDER — GLYCOPYRROLATE 0.2 MG/ML IJ SOLN
0.2000 mg | Freq: Once | INTRAMUSCULAR | Status: AC
  Administered 2015-01-06: 0.2 mg via INTRAVENOUS

## 2015-01-06 MED ORDER — FENTANYL CITRATE (PF) 100 MCG/2ML IJ SOLN
25.0000 ug | INTRAMUSCULAR | Status: AC
  Administered 2015-01-06 (×2): 25 ug via INTRAVENOUS

## 2015-01-06 MED ORDER — ONDANSETRON HCL 4 MG/2ML IJ SOLN
INTRAMUSCULAR | Status: AC
  Filled 2015-01-06: qty 2

## 2015-01-06 MED ORDER — LIDOCAINE VISCOUS 2 % MT SOLN
5.0000 mL | Freq: Once | OROMUCOSAL | Status: AC
  Administered 2015-01-06: 5 mL via OROMUCOSAL

## 2015-01-06 MED ORDER — MIDAZOLAM HCL 2 MG/2ML IJ SOLN
INTRAMUSCULAR | Status: AC
  Filled 2015-01-06: qty 2

## 2015-01-06 MED ORDER — FENTANYL CITRATE (PF) 100 MCG/2ML IJ SOLN
INTRAMUSCULAR | Status: AC
  Filled 2015-01-06: qty 2

## 2015-01-06 MED ORDER — LIDOCAINE HCL (CARDIAC) 10 MG/ML IV SOLN
INTRAVENOUS | Status: DC | PRN
  Administered 2015-01-06: 50 mg via INTRAVENOUS

## 2015-01-06 MED ORDER — ONDANSETRON HCL 4 MG/2ML IJ SOLN: 4.0000 mg | Freq: Once | INTRAMUSCULAR | Status: DC | PRN

## 2015-01-06 MED ORDER — SUCCINYLCHOLINE CHLORIDE 20 MG/ML IJ SOLN
INTRAMUSCULAR | Status: AC
  Filled 2015-01-06: qty 2

## 2015-01-06 MED ORDER — GLYCOPYRROLATE 0.2 MG/ML IJ SOLN
INTRAMUSCULAR | Status: AC
  Filled 2015-01-06: qty 3

## 2015-01-06 NOTE — H&P (View-Only) (Signed)
Primary Care Physician:  Purvis Kilts, MD Primary Gastroenterologist:  Dr. Gala Romney  Pre-Procedure History & Physical: HPI:  Maurice Richards is a 59 y.o. male here for consideration of a follow-up colonoscopy; has paper hematochezia. Describes protruding hemorrhoids for which he saw Dr. Romona Curls and underwent ultrasound treatment x3 or 4. Some improvement in protrusion and bleeding. History of colonic adenoma removed early in 2009. He is due for surveillance at this time. Long-standing GERD in the setting of a 20-30 pound weight gain. Intermittent esophageal dysphagia. Rare alcohol consumption.  Was told by PCP had a bump in his liver enzymes but I do not have a copy of the reports to review.  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 Fatty-appearing liver on recent ultrasound  Past Medical History  Diagnosis Date  . Hepatitis     hep C  . Cervical disc herniation   . Headache(784.0)     from cervical disc  . Anxiety   . Tubular adenoma     Past Surgical History  Procedure Laterality Date  . Knee arthroscopy Bilateral   . Rotator cuff repair Bilateral   . Tonsillectomy    . Umbilical hernia repair N/A 10/31/2013    Procedure: UMBILICAL HERNIORRHAPHY WITH MESH;  Surgeon: Jamesetta So, MD;  Location: [facility];  Service: General;  Laterality: N/A;  . Insertion of mesh N/A 10/31/2013    Procedure: INSERTION OF MESH;  Surgeon: Jamesetta So, MD;  Location: [facility];  Service: General;  Laterality: N/A;  Umbilical  . Colonoscopy  02/24/2007    Dr.Alizay Bronkema- friable anal canal hemorrhoids o/w normal rectum, diminutive mid descending colon polyps, a couple of submucosal petechiae on a couple folds in the L colon remainder of colonic mucosa appeared normal bx=tubular adenoma    Prior to Admission medications   Medication Sig Start Date End Date Taking? Authorizing Provider  diclofenac (VOLTAREN) 75 MG EC tablet Take 1 tablet (75 mg total) by  mouth 2 (two) times daily. 03/21/14  Yes Tamala Fothergill Regal, DPM  montelukast (SINGULAIR) 10 MG tablet Take 10 mg by mouth at bedtime.   Yes Historical Provider, MD  oxyCODONE-acetaminophen (PERCOCET) 7.5-325 MG per tablet Take 1-2 tablets by mouth every 4 (four) hours as needed. 10/31/13  Yes Aviva Signs, MD  acetaminophen (TYLENOL) 500 MG tablet Take 500 mg by mouth every 6 (six) hours as needed.    Historical Provider, MD  ALPRAZolam Duanne Moron) 1 MG tablet Take 1 mg by mouth 3 (three) times daily as needed for anxiety.    Historical Provider, MD  predniSONE (DELTASONE) 10 MG tablet 12 day tapering dose Patient not taking: Reported on 12/31/2014 04/01/14   Wallene Huh, DPM    Allergies as of 12/31/2014  . (No Known Allergies)    No family history on file.  Social History   Social History  . Marital Status: Divorced    Spouse Name: N/A  . Number of Children: N/A  . Years of Education: N/A   Occupational History  . Not on file.   Social History Main Topics  . Smoking status: Former Smoker -- 1.50 packs/day for 10 years    Types: Cigarettes  . Smokeless tobacco: Former Systems developer    Quit date: 10/22/1996  . Alcohol Use: Yes     Comment: seldom  . Drug Use: No  . Sexual Activity: Yes    Birth Control/ Protection: None   Other Topics Concern  .  Not on file   Social History Narrative    Review of Systems: See HPI, otherwise negative ROS  Physical Exam: BP 126/78 mmHg  Pulse 59  Temp(Src) 98.1 F (36.7 C) (Oral)  Ht 6' (1.829 m)  Wt 232 lb (105.235 kg)  BMI 31.46 kg/m2 General:   Alert,  Well-developed, well-nourished, pleasant and cooperative in NAD Skin:  Intact without significant lesions or rashes. Eyes:  Sclera clear, no icterus.   Conjunctiva pink. Neck:  Supple; no masses or thyromegaly. No significant cervical adenopathy. Lungs:  Clear throughout to auscultation.   No wheezes, crackles, or rhonchi. No acute distress. Heart:  Regular rate and rhythm; no murmurs,  clicks, rubs,  or gallops. Abdomen: Non-distended, normal bowel sounds.  Soft and nontender without appreciable mass or hepatosplenomegaly.  Pulses:  Normal pulses noted. Extremities:  Without clubbing or edema.  Impression:  Pleasant 59 year old gentleman history of a colonic adenoma;  due for surveillance examination at this time;paper hematochezia. History of symptomatic hemorrhoids  -  status post topical ultrasound therapy elsewhere. Long-standing GERD with esophageal dysplasia in the setting of a 20-30 pound weight gain over the past 3-4 years.  No prior EGD History of hepatitis C genotype 2 status post eradication many years ago with combination ribavirin and interferon therapy.  History of elevated liver enzymes without further information available at this time. Fatty liver on recent ultrasound.   Recommendations:     Schedule diagnostic colonoscopy (hematochezia) also, a hx of polyps and EGD with possible dilation (GERD and dysphagia).  The risks, benefits, limitations, imponderables and alternatives regarding both EGD and colonoscopy have been reviewed with the patient. Questions have been answered. All parties agreeable.  Split prep / Propofol. Will retrieve labs from [facility] for review.. Further recommendations to follow.      Notice: This dictation was prepared with Dragon dictation along with smaller phrase technology. Any transcriptional errors that result from this process are unintentional and may not be corrected upon review.

## 2015-01-06 NOTE — Anesthesia Postprocedure Evaluation (Signed)
Anesthesia Post Note  Patient: Maurice Richards  Procedure(s) Performed: Procedure(s) (LRB): ESOPHAGOGASTRODUODENOSCOPY (EGD) WITH PROPOFOL (N/A) MALONEY DILATION (N/A) COLONOSCOPY WITH PROPOFOL (N/A)  Patient location during evaluation: PACU Anesthesia Type: MAC Level of consciousness: awake and alert Pain management: pain level controlled Vital Signs Assessment: post-procedure vital signs reviewed and stable Respiratory status: spontaneous breathing and patient connected to nasal cannula oxygen Cardiovascular status: stable Anesthetic complications: no    Last Vitals:  Filed Vitals:   01/06/15 1315 01/06/15 1330  BP: 116/82 118/88  Pulse: 62 50  Temp:    Resp: 15 17    Last Pain: There were no vitals filed for this visit.               Drucie Opitz

## 2015-01-06 NOTE — Interval H&P Note (Signed)
History and Physical Interval Note:  01/06/2015 12:21 PM  Maurice Richards  has presented today for surgery, with the diagnosis of hematochezia, history of colon polyps, GERD, dysphagia  The various methods of treatment have been discussed with the patient and family. After consideration of risks, benefits and other options for treatment, the patient has consented to  Procedure(s): ESOPHAGOGASTRODUODENOSCOPY (EGD) WITH PROPOFOL (N/A) MALONEY DILATION (N/A) COLONOSCOPY WITH PROPOFOL (N/A) as a surgical intervention .  The patient's history has been reviewed, patient examined, no change in status, stable for surgery.  I have reviewed the patient's chart and labs.  Questions were answered to the patient's satisfaction.     Maurice Richards  No change.  EGD/ED and TCS per plan. The risks, benefits, limitations, imponderables and alternatives regarding both EGD and colonoscopy have been reviewed with the patient. Questions have been answered. All parties agreeable.

## 2015-01-06 NOTE — Transfer of Care (Signed)
Immediate Anesthesia Transfer of Care Note  Patient: Maurice Richards  Procedure(s) Performed: Procedure(s): ESOPHAGOGASTRODUODENOSCOPY (EGD) WITH PROPOFOL (N/A) MALONEY DILATION (N/A) COLONOSCOPY WITH PROPOFOL (N/A)  Patient Location: PACU  Anesthesia Type:MAC  Level of Consciousness: sedated and patient cooperative  Airway & Oxygen Therapy: Patient Spontanous Breathing and Patient connected to nasal cannula oxygen  Post-op Assessment: Report given to RN and Patient moving all extremities  Post vital signs: Reviewed and stable  Last Vitals:  Filed Vitals:   01/06/15 1215 01/06/15 1220  BP: 93/59 110/77  Pulse:    Temp:    Resp: 0 19    Complications: No apparent anesthesia complications

## 2015-01-06 NOTE — Discharge Instructions (Signed)
Colonoscopy Discharge Instructions  Read the instructions outlined below and refer to this sheet in the next few weeks. These discharge instructions provide you with general information on caring for yourself after you leave the hospital. Your doctor may also give you specific instructions. While your treatment has been planned according to the most current medical practices available, unavoidable complications occasionally occur. If you have any problems or questions after discharge, call Dr. Gala Romney at [phone]. ACTIVITY  You may resume your regular activity, but move at a slower pace for the next 24 hours.   Take frequent rest periods for the next 24 hours.   Walking will help get rid of the air and reduce the bloated feeling in your belly (abdomen).   No driving for 24 hours (because of the medicine (anesthesia) used during the test).    Do not sign any important legal documents or operate any machinery for 24 hours (because of the anesthesia used during the test).  NUTRITION  Drink plenty of fluids.   You may resume your normal diet as instructed by your doctor.   Begin with a light meal and progress to your normal diet. Heavy or fried foods are harder to digest and may make you feel sick to your stomach (nauseated).   Avoid alcoholic beverages for 24 hours or as instructed.  MEDICATIONS  You may resume your normal medications unless your doctor tells you otherwise.  WHAT YOU CAN EXPECT TODAY  Some feelings of bloating in the abdomen.   Passage of more gas than usual.   Spotting of blood in your stool or on the toilet paper.  IF YOU HAD POLYPS REMOVED DURING THE COLONOSCOPY:  No aspirin products for 7 days or as instructed.   No alcohol for 7 days or as instructed.   Eat a soft diet for the next 24 hours.  FINDING OUT THE RESULTS OF YOUR TEST Not all test results are available during your visit. If your test results are not back during the visit, make an appointment  with your caregiver to find out the results. Do not assume everything is normal if you have not heard from your caregiver or the medical facility. It is important for you to follow up on all of your test results.  SEEK IMMEDIATE MEDICAL ATTENTION IF:  You have more than a spotting of blood in your stool.   Your belly is swollen (abdominal distention).   You are nauseated or vomiting.   You have a temperature over 101.   You have abdominal pain or discomfort that is severe or gets worse throughout the day.   EGD Discharge instructions Please read the instructions outlined below and refer to this sheet in the next few weeks. These discharge instructions provide you with general information on caring for yourself after you leave the hospital. Your doctor may also give you specific instructions. While your treatment has been planned according to the most current medical practices available, unavoidable complications occasionally occur. If you have any problems or questions after discharge, please call your doctor. ACTIVITY  You may resume your regular activity but move at a slower pace for the next 24 hours.   Take frequent rest periods for the next 24 hours.   Walking will help expel (get rid of) the air and reduce the bloated feeling in your abdomen.   No driving for 24 hours (because of the anesthesia (medicine) used during the test).   You may shower.   Do not sign any  important legal documents or operate any machinery for 24 hours (because of the anesthesia used during the test).  NUTRITION  Drink plenty of fluids.   You may resume your normal diet.   Begin with a light meal and progress to your normal diet.   Avoid alcoholic beverages for 24 hours or as instructed by your caregiver.  MEDICATIONS  You may resume your normal medications unless your caregiver tells you otherwise.  WHAT YOU CAN EXPECT TODAY  You may experience abdominal discomfort such as a feeling of  fullness or gas pains.  FOLLOW-UP  Your doctor will discuss the results of your test with you.  SEEK IMMEDIATE MEDICAL ATTENTION IF ANY OF THE FOLLOWING OCCUR:  Excessive nausea (feeling sick to your stomach) and/or vomiting.   Severe abdominal pain and distention (swelling).   Trouble swallowing.   Temperature over 101 F (37.8 C).   Rectal bleeding or vomiting of blood.    GERD and hemorrhoid information provided  Begin Protonix 40 mg daily for GERD  Office visit with [place] in 6 months  Repeat colonoscopy in 5 years  If your hemorrhoids continue to bother you, we may be able to help you with banding in the office.  Gastroesophageal Reflux Disease, Adult Normally, food travels down the esophagus and stays in the stomach to be digested. However, when a person has gastroesophageal reflux disease (GERD), food and stomach acid move back up into the esophagus. When this happens, the esophagus becomes sore and inflamed. Over time, GERD can create small holes (ulcers) in the lining of the esophagus.  CAUSES This condition is caused by a problem with the muscle between the esophagus and the stomach (lower esophageal sphincter, or LES). Normally, the LES muscle closes after food passes through the esophagus to the stomach. When the LES is weakened or abnormal, it does not close properly, and that allows food and stomach acid to go back up into the esophagus. The LES can be weakened by certain dietary substances, medicines, and medical conditions, including:  Tobacco use.  Pregnancy.  Having a hiatal hernia.  Heavy alcohol use.  Certain foods and beverages, such as coffee, chocolate, onions, and peppermint. RISK FACTORS This condition is more likely to develop in:  People who have an increased body weight.  People who have connective tissue disorders.  People who use NSAID medicines. SYMPTOMS Symptoms of this condition include:  Heartburn.  Difficult or painful  swallowing.  The feeling of having a lump in the throat.  Abitter taste in the mouth.  Bad breath.  Having a large amount of saliva.  Having an upset or bloated stomach.  Belching.  Chest pain.  Shortness of breath or wheezing.  Ongoing (chronic) cough or a night-time cough.  Wearing away of tooth enamel.  Weight loss. Different conditions can cause chest pain. Make sure to see your health care provider if you experience chest pain. DIAGNOSIS Your health care provider will take a medical history and perform a physical exam. To determine if you have mild or severe GERD, your health care provider may also monitor how you respond to treatment. You may also have other tests, including:  An endoscopy toexamine your stomach and esophagus with a small camera.  A test thatmeasures the acidity level in your esophagus.  A test thatmeasures how much pressure is on your esophagus.  A barium swallow or modified barium swallow to show the shape, size, and functioning of your esophagus. TREATMENT The goal of treatment is to  help relieve your symptoms and to prevent complications. Treatment for this condition may vary depending on how severe your symptoms are. Your health care provider may recommend:  Changes to your diet.  Medicine.  Surgery. HOME CARE INSTRUCTIONS Diet  Follow a diet as recommended by your health care provider. This may involve avoiding foods and drinks such as:  Coffee and tea (with or without caffeine).  Drinks that containalcohol.  Energy drinks and sports drinks.  Carbonated drinks or sodas.  Chocolate and cocoa.  Peppermint and mint flavorings.  Garlic and onions.  Horseradish.  Spicy and acidic foods, including peppers, chili powder, curry powder, vinegar, hot sauces, and barbecue sauce.  Citrus fruit juices and citrus fruits, such as oranges, lemons, and limes.  Tomato-based foods, such as red sauce, chili, salsa, and pizza with red  sauce.  Fried and fatty foods, such as donuts, french fries, potato chips, and high-fat dressings.  High-fat meats, such as hot dogs and fatty cuts of red and white meats, such as rib eye steak, sausage, ham, and bacon.  High-fat dairy items, such as whole milk, butter, and cream cheese.  Eat small, frequent meals instead of large meals.  Avoid drinking large amounts of liquid with your meals.  Avoid eating meals during the 2-3 hours before bedtime.  Avoid lying down right after you eat.  Do not exercise right after you eat. General Instructions  Pay attention to any changes in your symptoms.  Take over-the-counter and prescription medicines only as told by your health care provider. Do not take aspirin, ibuprofen, or other NSAIDs unless your health care provider told you to do so.  Do not use any tobacco products, including cigarettes, chewing tobacco, and e-cigarettes. If you need help quitting, ask your health care provider.  Wear loose-fitting clothing. Do not wear anything tight around your waist that causes pressure on your abdomen.  Raise (elevate) the head of your bed 6 inches (15cm).  Try to reduce your stress, such as with yoga or meditation. If you need help reducing stress, ask your health care provider.  If you are overweight, reduce your weight to an amount that is healthy for you. Ask your health care provider for guidance about a safe weight loss goal.  Keep all follow-up visits as told by your health care provider. This is important. SEEK MEDICAL CARE IF:  You have new symptoms.  You have unexplained weight loss.  You have difficulty swallowing, or it hurts to swallow.  You have wheezing or a persistent cough.  Your symptoms do not improve with treatment.  You have a hoarse voice. SEEK IMMEDIATE MEDICAL CARE IF:  You have pain in your arms, neck, jaw, teeth, or back.  You feel sweaty, dizzy, or light-headed.  You have chest pain or shortness of  breath.  You vomit and your vomit looks like blood or coffee grounds.  You faint.  Your stool is bloody or black.  You cannot swallow, drink, or eat.   This information is not intended to replace advice given to you by your health care provider. Make sure you discuss any questions you have with your health care provider.   Document Released: 10/28/2004 Document Revised: 10/09/2014 Document Reviewed: 05/15/2014 Elsevier Interactive Patient Education 2016 [employer].  Hemorrhoids Hemorrhoids are swollen veins around the rectum or anus. There are two types of hemorrhoids:   Internal hemorrhoids. These occur in the veins just inside the rectum. They may poke through to the outside and become irritated  and painful.  External hemorrhoids. These occur in the veins outside the anus and can be felt as a painful swelling or hard lump near the anus. CAUSES  Pregnancy.   Obesity.   Constipation or diarrhea.   Straining to have a bowel movement.   Sitting for long periods on the toilet.  Heavy lifting or other activity that caused you to strain.  Anal intercourse. SYMPTOMS   Pain.   Anal itching or irritation.   Rectal bleeding.   Fecal leakage.   Anal swelling.   One or more lumps around the anus.  DIAGNOSIS  Your caregiver may be able to diagnose hemorrhoids by visual examination. Other examinations or tests that may be performed include:   Examination of the rectal area with a gloved hand (digital rectal exam).   Examination of anal canal using a small tube (scope).   A blood test if you have lost a significant amount of blood.  A test to look inside the colon (sigmoidoscopy or colonoscopy). TREATMENT Most hemorrhoids can be treated at home. However, if symptoms do not seem to be getting better or if you have a lot of rectal bleeding, your caregiver may perform a procedure to help make the hemorrhoids get smaller or remove them completely. Possible  treatments include:   Placing a rubber band at the base of the hemorrhoid to cut off the circulation (rubber band ligation).   Injecting a chemical to shrink the hemorrhoid (sclerotherapy).   Using a tool to burn the hemorrhoid (infrared light therapy).   Surgically removing the hemorrhoid (hemorrhoidectomy).   Stapling the hemorrhoid to block blood flow to the tissue (hemorrhoid stapling).  HOME CARE INSTRUCTIONS   Eat foods with fiber, such as whole grains, beans, nuts, fruits, and vegetables. Ask your doctor about taking products with added fiber in them (fibersupplements).  Increase fluid intake. Drink enough water and fluids to keep your urine clear or pale yellow.   Exercise regularly.   Go to the bathroom when you have the urge to have a bowel movement. Do not wait.   Avoid straining to have bowel movements.   Keep the anal area dry and clean. Use wet toilet paper or moist towelettes after a bowel movement.   Medicated creams and suppositories may be used or applied as directed.   Only take over-the-counter or prescription medicines as directed by your caregiver.   Take warm sitz baths for 15-20 minutes, 3-4 times a day to ease pain and discomfort.   Place ice packs on the hemorrhoids if they are tender and swollen. Using ice packs between sitz baths may be helpful.   Put ice in a plastic bag.   Place a towel between your skin and the bag.   Leave the ice on for 15-20 minutes, 3-4 times a day.   Do not use a donut-shaped pillow or sit on the toilet for long periods. This increases blood pooling and pain.  SEEK MEDICAL CARE IF:  You have increasing pain and swelling that is not controlled by treatment or medicine.  You have uncontrolled bleeding.  You have difficulty or you are unable to have a bowel movement.  You have pain or inflammation outside the area of the hemorrhoids. MAKE SURE YOU:  Understand these instructions.  Will watch your  condition.  Will get help right away if you are not doing well or get worse.   This information is not intended to replace advice given to you by your health care provider.  Make sure you discuss any questions you have with your health care provider.   Document Released: 01/16/2000 Document Revised: 01/05/2012 Document Reviewed: 11/23/2011 Elsevier Interactive Patient Education [employer].

## 2015-01-06 NOTE — Op Note (Signed)
[facility] [address] [place], [address]   COLONOSCOPY PROCEDURE REPORT  PATIENT: Maurice Richards, Maurice Richards  MR#: [id number] BIRTHDATE: 17-May-1955 , 59  yrs. old GENDER: male ENDOSCOPIST: R.  Garfield Cornea, MD FACP [facility] REFERRED [id number] Hilma Favors, M.D. PROCEDURE DATE:  12-Jan-2015 PROCEDURE:   Colonoscopy, surveillance INDICATIONS:History of colonic adenoma; paper hematochezia. MEDICATIONS: Deep sedation per Dr.  [facility] ASA CLASS:       Class II  CONSENT: The risks, benefits, alternatives and imponderables including but not limited to bleeding, perforation as well as the possibility of a missed lesion have been reviewed.  The potential for biopsy, lesion removal, etc. have also been discussed. Questions have been answered.  All parties agreeable.  Please see the history and physical in the medical record for more information.  DESCRIPTION OF PROCEDURE:   After the risks benefits and alternatives of the procedure were thoroughly explained, informed consent was obtained.  The digital rectal exam revealed no abnormalities of the rectum.  Patient had engorged grade 3 hemorrhoids 3 piles.   The     endoscope was introduced through the anus and advanced to the cecum, which was identified by both the appendix and ileocecal valve. No adverse events experienced.   The quality of the prep was adequate  The instrument was then slowly withdrawn as the colon was fully examined. Estimated blood loss is zero unless otherwise noted in this procedure report.      COLON FINDINGS: Grade 3 hemorrhoids; otherwise, normal-appearing rectum.  Normal-appearing colonic mucosa.  Retroflexion was performed. .  Withdrawal time=7 minutes 0 seconds.  The scope was withdrawn and the procedure completed. COMPLICATIONS: There were no immediate complications.  ENDOSCOPIC IMPRESSION: Grade 3 hemorrhoids. Normal colonoscopy.  RECOMMENDATIONS: Repeat colonoscopy for surveillance  purposes in 5 years. See EGD report. Office visit in 6 months. Patient may be a reasonably good hemorrhoid banding candidate.  eSigned:  R. Garfield Cornea, MD Rosalita Chessman [facility] January 12, 2015 1:28 PM   cc:  CPT CODES: ICD CODES:  The ICD and CPT codes recommended by this software are interpretations from the data that the clinical staff has captured with the software.  The verification of the translation of this report to the ICD and CPT codes and modifiers is the sole responsibility of the health care institution and practicing physician where this report was generated.  [employer]. will not be held responsible for the validity of the ICD and CPT codes included on this report.  AMA assumes no liability for data contained or not contained herein. CPT is a Designer, television/film set of the [employer].  PATIENT NAME:  Maurice Richards, Maurice Richards MR#: [id number]

## 2015-01-06 NOTE — Anesthesia Procedure Notes (Signed)
Procedure Name: MAC Date/Time: 01/06/2015 12:25 PM Performed by: Vista Deck Pre-anesthesia Checklist: Patient identified, Emergency Drugs available, Suction available, Timeout performed and Patient being monitored Patient Re-evaluated:Patient Re-evaluated prior to inductionOxygen Delivery Method: Non-rebreather mask

## 2015-01-06 NOTE — Anesthesia Preprocedure Evaluation (Signed)
Anesthesia Evaluation  Patient identified by MRN, date of birth, ID band Patient awake    Reviewed: Allergy & Precautions, H&P , NPO status , Patient's Chart, lab work & pertinent test results  Airway Mallampati: II  TM Distance: >3 FB Neck ROM: Limited    Dental  (+) Teeth Intact   Pulmonary former smoker,    breath sounds clear to auscultation       Cardiovascular negative cardio ROS   Rhythm:Regular Rate:Normal     Neuro/Psych  Headaches, PSYCHIATRIC DISORDERS Anxiety Cervical disc    GI/Hepatic negative GI ROS, (+) Hepatitis -, C  Endo/Other    Renal/GU      Musculoskeletal   Abdominal   Peds  Hematology   Anesthesia Other Findings   Reproductive/Obstetrics                             Anesthesia Physical Anesthesia Plan  ASA: III  Anesthesia Plan: MAC   Post-op Pain Management:    Induction: Intravenous  Airway Management Planned: Simple Face Mask  Additional Equipment:   Intra-op Plan:   Post-operative Plan:   Informed Consent: I have reviewed the patients History and Physical, chart, labs and discussed the procedure including the risks, benefits and alternatives for the proposed anesthesia with the patient or authorized representative who has indicated his/her understanding and acceptance.     Plan Discussed with:   Anesthesia Plan Comments:         Anesthesia Quick Evaluation

## 2015-01-06 NOTE — Op Note (Signed)
[facility] [address] [place], [address]   ENDOSCOPY PROCEDURE REPORT  PATIENT: Maurice Richards, Maurice Richards  MR#: [id number] BIRTHDATE: 15-Jun-1955 , 59  yrs. old GENDER: male ENDOSCOPIST: R.  Garfield Cornea, MD FACP [facility] REFERRED BY:  [name], M.D. PROCEDURE DATE:  23-Jan-2015 PROCEDURE:  EGD, diagnostic and Maloney dilation of esophagus INDICATIONS:  GERD; esophageal dysphagia. MEDICATIONS: Deep sedation per Dr.  [facility] ASA CLASS:      Class II  CONSENT: The risks, benefits, limitations, alternatives and imponderables have been discussed.  The potential for biopsy, esophogeal dilation, etc. have also been reviewed.  Questions have been answered.  All parties agreeable.  Please see the history and physical in the medical record for more information.  DESCRIPTION OF PROCEDURE: After the risks benefits and alternatives of the procedure were thoroughly explained, informed consent was obtained.  The    endoscope was introduced through the mouth and advanced to the second portion of the duodenum , limited by Without limitations. The instrument was slowly withdrawn as the mucosa was fully examined. Estimated blood loss is zero unless otherwise noted in this procedure report.    Circumferential distal esophageal erosions within 5 mm the GE junction.  No Barrett's epithelium.  Tubular esophagus appeared patent throughout its course.  Stomach empty.  2 cm hiatal hernia. Patent pylorus.  Normal-appearing first and second portion of the duodenum. A 56 French Maloney dilator was passed to full insertion easily.  A look back revealed no apparent complication related to this maneuver.  Retroflexed views revealed a hiatal hernia.     The scope was then withdrawn from the patient and the procedure completed.  COMPLICATIONS: There were no immediate complications.  ENDOSCOPIC IMPRESSION: Erosive reflux esophagitis. Status post passage of a Maloney dilator.  Small hiatal hernia.  RECOMMENDATIONS: Begin Protonix 40 mg daily. Office visit with [place] in 6 months. See colonoscopy report.  REPEAT EXAM:  eSigned:  R. Garfield Cornea, MD Rosalita Chessman [facility] 23-Jan-2015 1:24 PM    CC:  CPT CODES: ICD CODES:  The ICD and CPT codes recommended by this software are interpretations from the data that the clinical staff has captured with the software.  The verification of the translation of this report to the ICD and CPT codes and modifiers is the sole responsibility of the health care institution and practicing physician where this report was generated.  [employer] will not be held responsible for the validity of the ICD and CPT codes included on this report.  AMA assumes no liability for data contained or not contained herein. CPT is a Designer, television/film set of the [employer].  PATIENT NAME:  Maurice Richards, Maurice Richards MR#: [id number]

## 2015-01-08 ENCOUNTER — Encounter (HOSPITAL_COMMUNITY): Payer: Self-pay | Admitting: Internal Medicine

## 2015-02-13 ENCOUNTER — Ambulatory Visit (INDEPENDENT_AMBULATORY_CARE_PROVIDER_SITE_OTHER): Payer: BLUE CROSS/BLUE SHIELD

## 2015-02-13 ENCOUNTER — Ambulatory Visit (INDEPENDENT_AMBULATORY_CARE_PROVIDER_SITE_OTHER): Payer: BLUE CROSS/BLUE SHIELD | Admitting: Podiatry

## 2015-02-13 ENCOUNTER — Encounter: Payer: Self-pay | Admitting: Podiatry

## 2015-02-13 VITALS — BP 132/78 | HR 62 | Resp 16

## 2015-02-13 DIAGNOSIS — M722 Plantar fascial fibromatosis: Secondary | ICD-10-CM | POA: Diagnosis not present

## 2015-02-13 DIAGNOSIS — M79673 Pain in unspecified foot: Secondary | ICD-10-CM | POA: Diagnosis not present

## 2015-02-13 MED ORDER — TRIAMCINOLONE ACETONIDE 10 MG/ML IJ SUSP
10.0000 mg | Freq: Once | INTRAMUSCULAR | Status: AC
  Administered 2015-02-13: 10 mg

## 2015-02-13 MED ORDER — PREDNISONE 10 MG PO TABS: ORAL_TABLET | ORAL | Status: DC

## 2015-02-13 NOTE — Patient Instructions (Signed)

## 2015-02-16 NOTE — Progress Notes (Signed)
Subjective:     Patient ID: Maurice Richards, male   DOB: Apr 19, 1955, 60 y.o.   MRN: [id number]  HPI patient presents with reoccurrence of pain in the plantar aspects of both heels that are making it hard to walk. Does not remember specific injury   Review of Systems     Objective:   Physical Exam Neurovascular status intact muscle strength adequate with discomfort that's returned in the plantar heel region bilateral    Assessment:     Plantar fasciitis bilateral with inflammation    Plan:     Reviewed condition and at this time reinjected the plantar fascia bilateral 3 mg Kenalog 5 mill grams Xylocaine and instructed on reduced activity and supportive shoe and reappoint 3 weeks

## 2015-02-26 ENCOUNTER — Other Ambulatory Visit: Payer: Self-pay

## 2015-02-26 MED ORDER — PANTOPRAZOLE SODIUM 40 MG PO TBEC: 40.0000 mg | DELAYED_RELEASE_TABLET | Freq: Every day | ORAL | Status: DC

## 2015-04-01 ENCOUNTER — Encounter: Payer: BLUE CROSS/BLUE SHIELD | Admitting: Internal Medicine

## 2015-04-04 ENCOUNTER — Ambulatory Visit (INDEPENDENT_AMBULATORY_CARE_PROVIDER_SITE_OTHER): Payer: BLUE CROSS/BLUE SHIELD | Admitting: Internal Medicine

## 2015-04-04 ENCOUNTER — Encounter: Payer: Self-pay | Admitting: Internal Medicine

## 2015-04-04 VITALS — BP 120/77 | HR 59 | Temp 97.6°F | Ht 73.0 in | Wt 228.2 lb

## 2015-04-04 DIAGNOSIS — B369 Superficial mycosis, unspecified: Secondary | ICD-10-CM | POA: Diagnosis not present

## 2015-04-04 DIAGNOSIS — K648 Other hemorrhoids: Secondary | ICD-10-CM | POA: Diagnosis not present

## 2015-04-04 NOTE — Patient Instructions (Signed)
Avoid straining.  Benefiber 1 tablespoon twice daily  Limit toilet time to 2-5 minutes  Use miconazole nitrate 2% antifungal cream-apply pea-sized amount to your anal canal and just outside on the surrounding skin twice daily for 10 days  Call with any interim problems  Schedule followup appointment in 4 weeks from now

## 2015-04-04 NOTE — Addendum Note (Signed)
Encounter addended by: Daneil Dolin, MD on: 04/04/2015 10:06 AM<BR>     Documentation filed: Charges VN

## 2015-04-04 NOTE — Progress Notes (Signed)
[place] banding procedure note:  The patient presents with symptomatic grade 3 hemorrhoids; unresponsive to maximal medical therapy;  requesting rubber band ligation of his hemorrhoidal disease. All risks, benefits, and alternative forms of therapy were described and informed consent was obtained.  He notes "gaulding" with little malodorous discharge and difficulty with hygiene from time to time. He sweats a lot.  In the left lateral decubitus position, DRE performed with application of a pea-sized amount of 0.25% nitroglycerin cream and 2% Xylocaine cream. Easily reducible grade 3 hemorrhoids.  Intensely red skin circumferentially around the anal canal. Anoscopy performed. Prominent left lateral hemorrhoid observed. The decision was made to band the left lateral internal hemorrhoid; the [place] was used to perform band ligation without complication. Digital anorectal examination was then performed to assure proper positioning of the band; found to be in excellent position. No pinching or pain. The patient was discharged home without pain or other issues. Dietary and behavioral recommendations were given;  Course of miconazole nitrate 2% cream applied to the anorectum twice a day x10 days recommended  No complications were encountered and the patient tolerated the procedure well.

## 2015-04-14 ENCOUNTER — Other Ambulatory Visit: Payer: Self-pay | Admitting: Allergy

## 2015-04-14 MED ORDER — MONTELUKAST SODIUM 10 MG PO TABS: 10.0000 mg | ORAL_TABLET | Freq: Every day | ORAL | Status: DC

## 2015-04-29 ENCOUNTER — Encounter: Payer: Self-pay | Admitting: Internal Medicine

## 2015-04-29 ENCOUNTER — Ambulatory Visit (INDEPENDENT_AMBULATORY_CARE_PROVIDER_SITE_OTHER): Payer: BLUE CROSS/BLUE SHIELD | Admitting: Internal Medicine

## 2015-04-29 VITALS — BP 152/93 | HR 65 | Temp 97.2°F | Ht 73.0 in | Wt 231.2 lb

## 2015-04-29 DIAGNOSIS — K648 Other hemorrhoids: Secondary | ICD-10-CM

## 2015-04-29 NOTE — Progress Notes (Signed)
[facility] banding procedure note:  The patient presents with symptomatic grade3 hemorrhoids;  Status post banding of the left lateral hemorrhoid recently. Also given miconazole nitrate cream for tinea skin infection. States he's doing better although he developed significant diarrhea the day after placement and wonders if this detracted from the effectiveness of the band. He wishes another band be placed today.., . All risks, benefits, and alternative forms of therapy were described and informed consent was obtained.  In the left lateral decubitus position, DR with nitroglycerin cream and Xylocaine mixed, revealed a small grade 3 hemorrhoid..  The decision was made to band the right anterior internal hemorrhoid; the [place] was used to perform band ligation without complication. Digital anorectal examination was then performed to assure proper positioning of the band;  Attention her pain. Pain felt to be in excellent position, and to adjust the banded tissue as required. The patient was discharged home without pain or other issues. Dietary and behavioral recommendations were given;  Continue Benefiber 1 tablespoon daily as twice a day was too much for the patient along with follow-up instructions. The patient will return in 4 weeks for followup and possible additional banding as required.  No complications were encountered and the patient tolerated the procedure well.

## 2015-04-29 NOTE — Patient Instructions (Signed)
Avoid straining.  Benefiber 1 tablespoon daily  Limit toilet time to 2-5 minutes  Call with any interim problems  Schedule followup appointment in 4 weeks from now

## 2015-05-02 ENCOUNTER — Encounter: Payer: BLUE CROSS/BLUE SHIELD | Admitting: Internal Medicine

## 2015-05-07 DIAGNOSIS — M65351 Trigger finger, right little finger: Secondary | ICD-10-CM | POA: Insufficient documentation

## 2015-05-27 ENCOUNTER — Ambulatory Visit: Payer: BLUE CROSS/BLUE SHIELD | Admitting: Internal Medicine

## 2015-05-30 ENCOUNTER — Ambulatory Visit (INDEPENDENT_AMBULATORY_CARE_PROVIDER_SITE_OTHER): Payer: BLUE CROSS/BLUE SHIELD | Admitting: Internal Medicine

## 2015-05-30 ENCOUNTER — Encounter: Payer: Self-pay | Admitting: Internal Medicine

## 2015-05-30 VITALS — BP 145/96 | HR 64 | Temp 97.5°F | Ht 73.0 in | Wt 228.8 lb

## 2015-05-30 DIAGNOSIS — K648 Other hemorrhoids: Secondary | ICD-10-CM

## 2015-05-30 NOTE — Patient Instructions (Signed)
Avoid straining.  Resume Benefiber 1 tablespoon twice daily once diarrhea resolves  Limit toilet time to 5 minutes minutes  Call with any interim problems  Schedule followup appointment in 4-5 months from now

## 2015-05-30 NOTE — Progress Notes (Signed)
[facility] banding procedure note:  The patient presents with symptomatic 3 hemorrhoids;  Status post banding of the left lateral right anterior column. Overall improved. However he's had significant diarrhea with antibiotics recently related to sinus infection. He feels the diarrhea has exacerbated hemorrhoid still feels 1 prolapse. Overall was doing better however. He requested third banding placed today. All risks, benefits, and alternative forms of therapy were described and informed consent was obtained.  In the left lateral decubitus position, a DRE was performed utilizing a lubrication consisting of a pea-sized amount nitroglycerin 0.25% and 2% Xylocaine. Sphincter tone appropriate for small right posterior grade 3 hemorrhoid apparent. Very easily reducible.   The decision was made to band the right posterior hemorrhoid column;  the [place] was used to perform band ligation without complication. Digital anorectal examination was then performed to assure proper positioning of the band;  No pinching or pain after deployment. Band found to be in excellent position;The patient was discharged home without pain or other issues. Dietary and behavioral recommendations were given The patient will return 4 months for a recheck. No complications were encountered and the patient tolerated the procedure well.

## 2015-08-11 ENCOUNTER — Encounter: Payer: Self-pay | Admitting: Internal Medicine

## 2015-10-16 ENCOUNTER — Other Ambulatory Visit: Payer: Self-pay | Admitting: Allergy and Immunology

## 2015-10-28 ENCOUNTER — Ambulatory Visit: Payer: Self-pay | Admitting: Allergy & Immunology

## 2015-10-28 ENCOUNTER — Ambulatory Visit (INDEPENDENT_AMBULATORY_CARE_PROVIDER_SITE_OTHER): Payer: BLUE CROSS/BLUE SHIELD | Admitting: Allergy & Immunology

## 2015-10-28 ENCOUNTER — Encounter: Payer: Self-pay | Admitting: Allergy & Immunology

## 2015-10-28 VITALS — BP 110/60 | HR 65 | Temp 97.8°F | Resp 16

## 2015-10-28 DIAGNOSIS — J4520 Mild intermittent asthma, uncomplicated: Secondary | ICD-10-CM

## 2015-10-28 DIAGNOSIS — J019 Acute sinusitis, unspecified: Secondary | ICD-10-CM

## 2015-10-28 DIAGNOSIS — J310 Chronic rhinitis: Secondary | ICD-10-CM | POA: Diagnosis not present

## 2015-10-28 MED ORDER — AMOXICILLIN 875 MG PO TABS: 875.0000 mg | ORAL_TABLET | Freq: Two times a day (BID) | ORAL | 0 refills | Status: AC

## 2015-10-28 MED ORDER — ALBUTEROL SULFATE HFA 108 (90 BASE) MCG/ACT IN AERS: 2.0000 | INHALATION_SPRAY | RESPIRATORY_TRACT | 1 refills | Status: DC | PRN

## 2015-10-28 MED ORDER — MONTELUKAST SODIUM 10 MG PO TABS: 10.0000 mg | ORAL_TABLET | Freq: Every day | ORAL | 1 refills | Status: DC

## 2015-10-28 MED ORDER — FLUTICASONE PROPIONATE 50 MCG/ACT NA SUSP: 2.0000 | Freq: Every day | NASAL | 1 refills | Status: DC

## 2015-10-28 NOTE — Patient Instructions (Addendum)
1. Mild intermittent asthma, uncomplicated - Continue with montelukast 10mg  daily. - Continue with ProAir 4 puffs every 4-6 hours as needed. - Lung function looked good today.  2. Chronic rhinitis - Continue with nasal saline rinses daily. - Continue with Flonase one spray per nostril daily  3. Acute sinusitis, recurrence not specified, unspecified location - Start amoxicillin 875mg  tablets twice daily for 14 days. - Complete the entire course.  4. Return in about 1 year (around 10/27/2016).  Please inform us of any Emergency Department visits, hospitalizations, or changes in symptoms. Call us before going to the ED for breathing or allergy symptoms since we might be able to fit you in for a sick visit. Feel free to contact us anytime with any questions, problems, or concerns.  It was a pleasure to meet you today!   Websites that have reliable patient information: 1. American Academy of Asthma, Allergy, and Immunology: www.aaaai.org 2. Food Allergy Research and Education (FARE): foodallergy.org 3. Mothers of Asthmatics: http://www.asthmacommunitynetwork.org 4. American College of Allergy, Asthma, and Immunology: www.acaai.org

## 2015-10-28 NOTE — Progress Notes (Signed)
FOLLOW UP  Date of Service/Encounter:  10/28/15   Assessment:   Mild intermittent asthma, uncomplicated - Plan: Spirometry with Graph  Chronic rhinitis  Acute sinusitis, recurrence not specified, unspecified location   Asthma Reportables:  Severity: intermittent  Risk: low Control: well controlled  Seasonal Influenza Vaccine: no but encouraged    Plan/Recommendations:    1. Mild intermittent asthma, uncomplicated - Continue with montelukast 10mg  daily. - Continue with ProAir 4 puffs every 4-6 hours as needed. - Lung function looked good today. - If he needs albuterol more than 1 time per week, we may need to start a controller medication.  2. Chronic rhinitis - Continue with nasal saline rinses daily. - Continue with Flonase one spray per nostril daily. - I encouraged him to strongly consider allergen immunotherapy, however given the [place], he decided to hold off.  3. Acute sinusitis, recurrence not specified, unspecified location - Start amoxicillin 875mg  tablets twice daily for 14 days. - Complete the entire course. - I encouraged him to continue with the nasal saline rinses. -There is no indication for an immune workup at this point since he receives antibiotics only 1-2 times per year.   4. Return in about 1 year (around 10/27/2016).      Subjective:   Maurice Richards is a 60 y.o. male presenting today for follow up of  Chief Complaint  Patient presents with  . Nasal Congestion  .  Maurice Richards has a history of the following: Patient Active Problem List   Diagnosis Date Noted  . Third degree hemorrhoids   . Reflux esophagitis   . Hiatal hernia   . GERD (gastroesophageal reflux disease) 12/31/2014  . Dysphagia 12/31/2014  . History of colonic polyps 12/31/2014    History obtained from: chart review and patient.  Maurice Richards was referred by Purvis Kilts, MD.     Maurice Richards is a 60 y.o. male presenting  for a follow up visit for nasal congestion. Maurice Richards was last seen in September 2016 by Dr. Verlin Fester. At that time, his spirometry was notable for Post bronchodilator response. He was treated for an asthma exacerbation. He was also treated for sinusitis with an azithromycin course. It is recommended that he continue nasal saline lavage and he was started on Dymistaone spray per nostril twice daily. He is unsure what he was allergic to and we do not have his testing here today.   Since last visit, he reports that he has "ups and downs". Around 3-4 days ago, he has had chronic congestion, sinus pressure, and tooth pain. He does perform nasal saline rinses daily and sometimes more. He did use Dymista, but his insurance would not cover it. He uses fluticasone daily instead but he does not like the taste. He continues to take montelukast 10mg  and cetirizine 10mg  daily. His symptoms are worse in the spring and the fall. He endorses frequent sneezing.  Maurice Richards does have ProAir which he uses around twice per week on average. He does not use it often. He denies nighttime coughing, but does endorse daily wheezing which clears with blowing his nose, which makes me think that his wheezing might be more nasal congestion rather than true wheezing. He does have antibiotics for sinus infections 1-2 times per year at the most.    Otherwise, there have been no changes to the past medical history, surgical history, family history, or social history. [place]. He frequently works outside.   Review of Systems: a 14-point review of systems is pertinent for what is mentioned in HPI.  Otherwise, all other systems were negative. Constitutional: negative other than that listed in the HPI Eyes: negative other than that listed in the HPI Ears, nose, mouth, throat, and face: negative other than that listed in the HPI Respiratory: negative other than that listed in  the HPI Cardiovascular: negative other than that listed in the HPI Gastrointestinal: negative other than that listed in the HPI Genitourinary: negative other than that listed in the HPI Integument: negative other than that listed in the HPI Hematologic: negative other than that listed in the HPI Musculoskeletal: negative other than that listed in the HPI Neurological: negative other than that listed in the HPI Allergy/Immunologic: negative other than that listed in the HPI    Objective:   Blood pressure 110/60, pulse 65, temperature 97.8 F (36.6 C), temperature source Oral, resp. rate 16, SpO2 99 %. There is no height or weight on file to calculate BMI.   Physical Exam:  General: Alert, interactive, in no acute distress. Cooperative with the exam.  HEENT: TMs pearly gray, turbinates edematous with thick discharge, post-pharynx erythematous. Neck: Supple without thyromegaly. Lungs: Clear to auscultation without wheezing, rhonchi or rales. No increased work of breathing. CV: Normal S1, S2 without murmurs. Capillary refill <2 seconds.  Abdomen: Nondistended, nontender. Skin: Warm and dry, without lesions or rashes. Extremities:  No clubbing, cyanosis or edema. Neuro:   Grossly intact. No focal deficits.   Diagnostic studies:  Spirometry: results abnormal (FEV1: 3.47/94%, FVC: 4.05/86%, FEV1/FVC: 85%).    Spirometry consistent with normal pattern   Allergy Studies: None   Salvatore Marvel, MD [facility]

## 2016-02-11 ENCOUNTER — Other Ambulatory Visit (HOSPITAL_COMMUNITY): Payer: Self-pay | Admitting: Internal Medicine

## 2016-02-11 ENCOUNTER — Ambulatory Visit (HOSPITAL_COMMUNITY)
Admission: RE | Admit: 2016-02-11 | Discharge: 2016-02-11 | Disposition: A | Discharge status: Home / Self Care | Payer: BLUE CROSS/BLUE SHIELD | Source: Ambulatory Visit | Attending: Internal Medicine | Admitting: Internal Medicine

## 2016-02-11 DIAGNOSIS — M4316 Spondylolisthesis, lumbar region: Secondary | ICD-10-CM | POA: Diagnosis not present

## 2016-02-11 DIAGNOSIS — M549 Dorsalgia, unspecified: Secondary | ICD-10-CM | POA: Diagnosis present

## 2016-02-24 ENCOUNTER — Ambulatory Visit (INDEPENDENT_AMBULATORY_CARE_PROVIDER_SITE_OTHER): Payer: BLUE CROSS/BLUE SHIELD | Admitting: Internal Medicine

## 2016-02-24 VITALS — BP 141/85 | HR 84 | Temp 97.9°F | Ht 72.0 in | Wt 226.6 lb

## 2016-02-24 DIAGNOSIS — K648 Other hemorrhoids: Secondary | ICD-10-CM | POA: Diagnosis not present

## 2016-02-24 NOTE — Patient Instructions (Addendum)
Avoid straining.  Benefiber 1 tablespoon twice daily  Limit toilet time to 5 minutes  Avoid extreme heavy lifting.  OTC miconazole nitrate cream 2%-apply to the irritated anorectum 2-3 times daily as needed.  Call with any interim problems  Schedule followup appointment in 2-3 weeks from now

## 2016-02-24 NOTE — Progress Notes (Signed)
[place] banding procedure note:  The patient presents with symptomatic grade 3 hemorrhoids;  history of [facility] banding about a year ago. May not have had all of his hemorrhoids banded. He developed diarrhea right after 1 band was placed and felt it didn't stick. Has been renovating a home recently. Doing quite a bit of heavy, heavy lifting. Slight amount of seepage on toilet paper. Denies straining to have a bowel movement. He states that after banding was done last year all of his hemorrhoid symptoms essentially resolved until the last several weeks. When he gets a sinus infection he perceives that drainage causes bowels to act up. Little malodorous drainage from time to time.  He denies pain. Last colonoscopy 2016 prominent grade 3 hemorrhoids. History of colonic polyps-due for surveillance colonoscopy 2021.  He desires examination additional band placement if it is appropriate. I again told him that with any method of hemorrhoid treatment, there is a potential for recurrence. No one can issue 100% guarantee that hemorrhoid symptoms will be resolved permanently by any modality of treatment. His questions were answered. He is agreeable.   In the left lateral decubitus position, a DRE  revealed a easily reducible grade 3 hemorrhoid-small. All in all renal area looks better than seen last time although is there is intense erythema involving the skin circumferentially the perianal area consistent with yeast infection.   The decision was made to band the in the left and right lateral/posterior hemorrhoid columns.  the [place] was used to perform band ligation without complication. Digital anorectal examination was then performed to assure proper positioning of the band after each band placed. Band found to be in excellent position. No itching or pain during or immediately following band placement. Bands in excellent position. No adjustment felt to be needed. The patient was discharged home without pain or  other issues. Dietary and behavioral recommendations were given  No complications were encountered and the patient tolerated the procedure well.  Patient prescribed OTC miconazole nitrate cream 2% apply to the anorectum 2-3 times daily as needed.  Office visit with [place] early March no later than mid-March for reassessment.

## 2016-03-30 ENCOUNTER — Ambulatory Visit (INDEPENDENT_AMBULATORY_CARE_PROVIDER_SITE_OTHER): Payer: BLUE CROSS/BLUE SHIELD | Admitting: Internal Medicine

## 2016-03-30 ENCOUNTER — Encounter: Payer: Self-pay | Admitting: Internal Medicine

## 2016-03-30 VITALS — BP 134/82 | HR 58 | Temp 97.8°F | Ht 73.0 in | Wt 222.2 lb

## 2016-03-30 DIAGNOSIS — K648 Other hemorrhoids: Secondary | ICD-10-CM

## 2016-03-30 DIAGNOSIS — K219 Gastro-esophageal reflux disease without esophagitis: Secondary | ICD-10-CM

## 2016-03-30 MED ORDER — PANTOPRAZOLE SODIUM 40 MG PO TBEC: 40.0000 mg | DELAYED_RELEASE_TABLET | Freq: Every day | ORAL | 3 refills | Status: DC

## 2016-03-30 NOTE — Progress Notes (Signed)
[place] banding procedure note:  The patient presents with symptomatic grade 2/3 hemorrhoids;  status post banding of the right anterior left lateral hemorrhoid band. Patient states, globally, all of his hemorrhage symptoms have improved. He is here for follow-up assessment and additional banding. All risks, benefits, and alternative forms of therapy were described and informed consent was obtained. As a  separate issue, GERD symptoms well controlled on Protonix 40 mg daily; he does have occasional breakthrough symptoms. If he misses a single dose,  he has significant symptoms. No melena or dysphagia noted.   In the left lateral decubitus position, a DRE revealed no abnormalities. No external lesions.  The decision was made to band the right posterior internal hemorrhoid and the [place] was used to perform band ligation without complication. Digital anorectal examination was then performed to assure proper positioning of the band;  No pinching or pain. I elected to go ahead put another band on the left lateral column as a slight amount of redundant tissue noted in this direction. This was done without difficulty. No pinching or pain. Follow-up DRE revealed bands to be in good position.The patient was discharged home without pain or other issues. No complications were encountered and the patient tolerated the procedure well.  Recommendations:  Benefiber 1 tablespoon twice daily  Limit toilet time to 5 minutes  Call with any interim problems  Continue Protonix 40 mg daily (okay to refill:  dispense 90 with 3 more refills)  Schedule followup appointment in one year

## 2016-03-30 NOTE — Patient Instructions (Signed)
Avoid straining.  Benefiber 1 tablespoon twice daily  Limit toilet time to 5 minutes  Call with any interim problems  Continue Protonix 40 mg daily (okay to refill:  dispense 90 with 3 more refills)  Schedule followup appointment in one year

## 2016-04-14 ENCOUNTER — Other Ambulatory Visit: Payer: Self-pay | Admitting: Internal Medicine

## 2016-04-20 ENCOUNTER — Other Ambulatory Visit: Payer: Self-pay | Admitting: Allergy & Immunology

## 2016-04-24 ENCOUNTER — Other Ambulatory Visit: Payer: Self-pay | Admitting: Allergy & Immunology

## 2016-06-29 ENCOUNTER — Ambulatory Visit: Payer: BLUE CROSS/BLUE SHIELD | Admitting: Allergy and Immunology

## 2016-08-31 DIAGNOSIS — J45909 Unspecified asthma, uncomplicated: Secondary | ICD-10-CM | POA: Insufficient documentation

## 2016-08-31 DIAGNOSIS — B4481 Allergic bronchopulmonary aspergillosis: Secondary | ICD-10-CM | POA: Insufficient documentation

## 2016-10-11 ENCOUNTER — Other Ambulatory Visit: Payer: Self-pay | Admitting: Allergy & Immunology

## 2016-10-14 ENCOUNTER — Ambulatory Visit (INDEPENDENT_AMBULATORY_CARE_PROVIDER_SITE_OTHER): Payer: BLUE CROSS/BLUE SHIELD | Admitting: Allergy & Immunology

## 2016-10-14 ENCOUNTER — Encounter: Payer: Self-pay | Admitting: Allergy & Immunology

## 2016-10-14 VITALS — BP 128/80 | HR 59 | Resp 16 | Ht 70.5 in | Wt 224.5 lb

## 2016-10-14 DIAGNOSIS — J3089 Other allergic rhinitis: Secondary | ICD-10-CM

## 2016-10-14 DIAGNOSIS — J302 Other seasonal allergic rhinitis: Secondary | ICD-10-CM

## 2016-10-14 DIAGNOSIS — J4540 Moderate persistent asthma, uncomplicated: Secondary | ICD-10-CM

## 2016-10-14 DIAGNOSIS — D721 Eosinophilia, unspecified: Secondary | ICD-10-CM
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
## 2016-10-14 NOTE — Progress Notes (Signed)
FOLLOW UP  Date of Service/Encounter:  10/14/16   Assessment:   Moderate persistent asthma, uncomplicated  Seasonal and perennial allergic rhinitis  Eosinophilia   Asthma Reportables:  Severity: moderate persistent  Risk: high Control: not well controlled   Plan/Recommendations:   1. Moderate persistent asthma, uncomplicated - Lung function does not look great today, but it did improve with albuterol nebulizer use.  - We will get the medical records from Maurice Richards pulmonologist and oncologist to get a better idea of what they were thinking with your recent exacerbation of your symptoms. - With the history of elevated eosinophils, a medication called Berna Bue would be helpful. - Information on Fasenra provided and Consent filled out. - Our Biologics Manager - Maurice Richards - will reach out to Maurice Richards to discuss further.  - In the meantime, I recommended that Maurice Richards continue with [place] and montelukast.  - Daily controller medication(s): Breo 200/25 one puff once daily + Singulair (montelukast) 10mg  daily - Rescue medications: ProAir 4 puffs every 4-6 hours as needed - Asthma control goals:  * Full participation in all desired activities (may need albuterol before activity) * Albuterol use two time or less a week on average (not counting use with activity) * Cough interfering with sleep two time or less a month * Oral steroids no more than once a year * No hospitalizations  2. Seasonal and perennial allergic rhinitis (molds, dog, and dust mite ) - Given your continued nasal congestion, I recommended that Maurice Richards change from [place] to [place].  - Sample of Xhance provided today: two sprays per nostril once daily. - He will follow up in one month to let us know how this is working for him.  - Allergy shots could be considered, but he did not have impressive sensitizations at his last testing and he would require updated testing.  - [place] also [place] between  [place], therefore shots would not work with his current living situation.   3. Acute sinusitis - With Maurice Richards current symptoms and time course, antibiotics are needed: Augmentin 875mg  twice daily for two weeks - We will not add on steroids at this time since he has recently completed 2-3 months of prednisone - Add on nasal saline spray (i.e., Simply Saline) or nasal saline lavage (i.e., NeilMed) as needed prior to medicated nasal sprays. - For thick post nasal drainage, add guaifenesin [phone] mg (Mucinex)  twice daily as needed with adequate hydration as discussed.  4. Return in about 4 weeks (around 11/11/2016).  Subjective:   Maurice Richards is a 61 y.o. male presenting today for follow up of  Chief Complaint  Patient presents with  . Asthma  . Allergic Rhinitis     Maurice Richards has a history of the following: Patient Active Problem List   Diagnosis Date Noted  . Seasonal and perennial allergic rhinitis 10/15/2016  . Moderate persistent asthma, uncomplicated 23/53/6144  . Third degree hemorrhoids   . Reflux esophagitis   . Hiatal hernia   . GERD (gastroesophageal reflux disease) 12/31/2014  . Dysphagia 12/31/2014  . History of colonic polyps 12/31/2014    History obtained from: chart review and patient.  Maurice Richards's Primary Care Provider is Maurice Sites, MD.     Maurice Richards is a 61 y.o. male presenting for a follow up visit. Maurice Richards was last seen in September 2017. At that time, his asthma was under good control with Singulair 10mg  daily as well as  ProAir as needed. He has a history of chronic rhinitis with testing in 2012 that was positive only to molds, dog, and dust mite (all on intradermals only). He was continued on nasal saline rinses daily as well as Flonase one spray per nostril daily. I diagnosed him with sinusitis and started him on Augmentin BID for two weeks.   Since the last visit, he has not done very well. He was diagnosed with influenza  in January 2018 and had worsening respiratory status thereafter. He continued with these symptoms until July 2018 when he was in [place]. He did consider coming back to see [place], but instead he sought the help of a pulmonologist (Maurice Richards at [facility]). Evidently he did an extensive workup including labs that showed eosinophilia of 2040. He also had an environmental allergy panel that showed slightly elevated IgE to cat and dog only. A total serum IgE level was 1165. An SPEP was performed that was normal. These are the only labs that I can see through [place], but Mr. Sayres reports that he had CXR as well as a chest CT and abdominal CT that were all normal. According to the notes I can read, Maurice Richards was concerned with ABPA.   Maurice Richards started him on Breo 100/25 one puff once daily, which he feels has helped tremendously. Evidently he was placed on steroids for 2-3 months, which were recently completed. With the elevated eosinophil count. Maurice Richards was concerned with a malignancy and he was referred to an oncologist (Maurice Richards at [facility] in [place]). According to the patient, she was not concerned with malignancy at all and felt that his elevated eosinophils and elevated IgE were secondary to uncontrolled atopic disease. She recommended no further workup. Unfortunately, I cannot see any of her notes today.   After completing the steroids and starting on the [facility], he feels fairly well. However, he continues to have nasal congestion and actually feels that the majority of his symptoms were secondary to uncontrolled rhinitis. He is only on Flonase at this time. We had discussed allergy shots in the past, but his traveling has always made these untenable. He spends the majority of his time in [place], but does come back to [place] at least once per month. Insurance coverage has been an issue as well, but he has now obtained insurance  coverage through the [employer].    Previous Skin Testing (December 2012)     Otherwise, there have been no changes to his past medical history, surgical history, family history, or social history.    Review of Systems: a 14-point review of systems is pertinent for what is mentioned in HPI.  Otherwise, all other systems were negative. Constitutional: negative other than that listed in the HPI Eyes: negative other than that listed in the HPI Ears, nose, mouth, throat, and face: negative other than that listed in the HPI Respiratory: negative other than that listed in the HPI Cardiovascular: negative other than that listed in the HPI Gastrointestinal: negative other than that listed in the HPI Genitourinary: negative other than that listed in the HPI Integument: negative other than that listed in the HPI Hematologic: negative other than that listed in the HPI Musculoskeletal: negative other than that listed in the HPI Neurological: negative other than that listed in the HPI Allergy/Immunologic: negative other than that listed in the HPI    Objective:   Blood pressure 128/80, pulse (!) 59,  resp. rate 16, height 5' 10.5" (1.791 m), weight 224 lb 8 oz (101.8 kg), SpO2 92 %. Body mass index is 31.76 kg/m.   117/79 Sunday  Physical Exam:  General: Alert, interactive, in no acute distress. Pleasant and interactive.  Eyes: No conjunctival injection present on the right, No conjunctival injection present on the left, PERRL bilaterally, No discharge on the right, No discharge on the left and No Horner-Trantas dots present Ears: Right TM pearly gray with normal light reflex, Left TM pearly gray with normal light reflex, Right TM intact without perforation and Left TM intact without perforation.  Nose/Throat: External nose within normal limits and septum midline, turbinates edematous and pale with clear discharge, post-pharynx erythematous with cobblestoning in the  posterior oropharynx. Tonsils 2+ without exudates Neck: Supple without thyromegaly. Lungs: Clear to auscultation without wheezing, rhonchi or rales. No increased work of breathing. CV: Normal S1/S2, no murmurs. Capillary refill <2 seconds.  Skin: Warm and dry, without lesions or rashes. Neuro:   Grossly intact. No focal deficits appreciated. Responsive to questions.   Diagnostic studies:   Spirometry: results normal (FEV1: 2.212/59%, FVC: 3.55/76%, FEV1/FVC: 62%).    Spirometry consistent with mild obstructive disease. Albuterol/Atrovent nebulizer treatment given in clinic with improvement although it was not significant per ATS criteria.  Allergy Studies: none     Salvatore Marvel, MD [facility] [place]

## 2016-10-15 ENCOUNTER — Other Ambulatory Visit: Payer: Self-pay | Admitting: *Deleted

## 2016-10-15 DIAGNOSIS — J302 Other seasonal allergic rhinitis: Secondary | ICD-10-CM | POA: Insufficient documentation

## 2016-10-15 DIAGNOSIS — J4540 Moderate persistent asthma, uncomplicated: Secondary | ICD-10-CM | POA: Insufficient documentation

## 2016-10-15 DIAGNOSIS — J3089 Other allergic rhinitis: Secondary | ICD-10-CM | POA: Insufficient documentation

## 2016-10-15 MED ORDER — AMOXICILLIN-POT CLAVULANATE 875-125 MG PO TABS: 1.0000 | ORAL_TABLET | Freq: Two times a day (BID) | ORAL | 0 refills | Status: AC

## 2016-10-15 NOTE — Telephone Encounter (Signed)
Had called patient to discuss Berna Bue and advise submission.  He advised that he was expecting to pick up Rx at [facility] and nothing was called in.  I looked into chart and looked like Dr Ernst Bowler was going to call in Augmentin.  Advised patient to check with pharmacy in about an hour and called Dr Ernst Bowler to inquire if that was the Rx he was going to send and he advised it was.  I told him I would go ahead and send

## 2016-10-29 ENCOUNTER — Encounter: Payer: Self-pay | Admitting: Allergy

## 2016-10-29 ENCOUNTER — Ambulatory Visit (INDEPENDENT_AMBULATORY_CARE_PROVIDER_SITE_OTHER): Payer: BLUE CROSS/BLUE SHIELD | Admitting: Allergy

## 2016-10-29 VITALS — BP 122/80 | HR 71 | Resp 19

## 2016-10-29 DIAGNOSIS — J0100 Acute maxillary sinusitis, unspecified: Secondary | ICD-10-CM

## 2016-10-29 DIAGNOSIS — J4540 Moderate persistent asthma, uncomplicated: Secondary | ICD-10-CM

## 2016-10-29 DIAGNOSIS — J302 Other seasonal allergic rhinitis: Secondary | ICD-10-CM

## 2016-10-29 DIAGNOSIS — J3089 Other allergic rhinitis: Secondary | ICD-10-CM

## 2016-10-29 MED ORDER — AZELASTINE HCL 0.1 % NA SOLN: 2.0000 | Freq: Two times a day (BID) | NASAL | 5 refills | Status: DC

## 2016-10-29 MED ORDER — PSEUDOEPHEDRINE-GUAIFENESIN ER 120-1200 MG PO TB12: 1200.0000 mg/d | ORAL_TABLET | Freq: Every day | ORAL | 0 refills | Status: DC | PRN

## 2016-10-29 NOTE — Progress Notes (Signed)
Follow-up Note  RE: Maurice Richards MRN: [id number] DOB: Jul 12, 1955 Date of Office Visit: 10/29/2016   History of present illness: Maurice Richards is a 61 y.o. male presenting today for a sick visit. He was last seen on 10/14/16 and treated for acute sinusitis with a 2 week course of Augmentin 875 mg twice daily (last dose today), Xhance, Mucinex, and started on nasal saline spray. He says he has been taking his antibiotics and Xhance daily. He is not sure if he is taking Mucinex. He is only occasionally using saline irrigation. He returned to clinic today after an episode last night in which he had significant mucus drainage/post-nasal drip in the back of his throat and sinus congestion when he was laying down to sleep. He felt like he was "drowning." He has associated non-productive cough and sinus headache, feeling like he is "stopped up." He has noticed occasional wheezing as well. He previously had yellow nasal discharge, which he says is now clear. He does feel like his sinus congestion and drainage is improving. He denies any recent fevers or chills.  For his asthma, he is using his Breo daily but not really using his albuterol rescue inhaler. He thinks he is taking Singulair every morning, but is not really sure about any of his oral medications. He is in the process of starting Berna Bue as he has a history of eosinophilia. He has been seen by Pulmonoogy and Oncology in [place] for his eosinophilia (and concern for ABPA), with reportedly negative workup including SPEP, CXR, CT chest/abdomen (records not available today).     Review of systems: Review of Systems  Constitutional: Negative for chills, fever and malaise/fatigue.  HENT: Positive for congestion and sinus pain. Negative for ear discharge, ear pain, nosebleeds, sore throat and tinnitus.   Eyes: Negative for discharge and redness.  Respiratory: Positive for cough and wheezing. Negative for shortness of breath.     Cardiovascular: Negative for chest pain.  Gastrointestinal: Negative for abdominal pain, constipation, diarrhea, heartburn, nausea and vomiting.  Musculoskeletal: Negative for joint pain.  Skin: Negative for itching and rash.  Neurological: Positive for headaches.    All other systems negative unless noted above in HPI  Past medical/social/surgical/family history have been reviewed and are unchanged unless specifically indicated below.  No changes  Medication List: Allergies as of 10/29/2016   No Known Allergies     Medication List       Accurate as of 10/29/16 12:29 PM. Always use your most recent med list.          acetaminophen 500 MG tablet Commonly known as:  TYLENOL Take 500 mg by mouth every 6 (six) hours as needed. Reported on 05/30/2015   albuterol 108 (90 Base) MCG/ACT inhaler Commonly known as:  PROAIR HFA Inhale 2 puffs into the lungs every 4 (four) hours as needed for wheezing or shortness of breath.   amoxicillin-clavulanate 875-125 MG tablet Commonly known as:  AUGMENTIN Take 1 tablet by mouth 2 (two) times daily.   BENEFIBER PO Take by mouth.   BREO ELLIPTA 100-25 MCG/INH Aepb Generic drug:  fluticasone furoate-vilanterol   cetirizine 10 MG chewable tablet Commonly known as:  ZYRTEC Chew 10 mg by mouth daily.   fluticasone 50 MCG/ACT nasal spray Commonly known as:  FLONASE PLACE 2 SPRAYS INTO BOTH NOSTRILS DAILY.   ibuprofen 200 MG tablet Commonly known as:  ADVIL,MOTRIN Take 200 mg by mouth every 6 (six) hours as needed. Reported on 05/30/2015  montelukast 10 MG tablet Commonly known as:  SINGULAIR TAKE 1 TABLET (10 MG TOTAL) BY MOUTH AT BEDTIME.   oxyCODONE-acetaminophen 7.5-325 MG tablet Commonly known as:  PERCOCET Take 1-2 tablets by mouth every 4 (four) hours as needed.   pantoprazole 40 MG tablet Commonly known as:  PROTONIX TAKE 1 TABLET BY MOUTH EVERY DAY       Known medication allergies: No Known Allergies   Physical  examination: Blood pressure 122/80, pulse 71, resp. rate 19, SpO2 95 %.  General: Alert, interactive, in no acute distress. HEENT: TMs pearly gray, turbinates moderately edematous without discharge, post-pharynx non erythematous. Neck: Supple without lymphadenopathy. Lungs: Clear to auscultation without wheezing, rhonchi or rales. {no increased work of breathing. CV: Normal S1, S2 without murmurs. Abdomen: Nondistended, nontender. Skin: Warm and dry, without lesions or rashes. Extremities:  No clubbing, cyanosis or edema. Neuro:   Grossly intact.  Diagnositics/Labs: None obtained this visit.  Assessment and plan:   Acute sinusitis - Appears to be resolving with your antibiotics and nasal steroid spray - Please finish your antibiotics: Augmentin 875mg  twice daily for two weeks; last does today. - Increase nasal saline spray (i.e., Simply Saline) or nasal saline lavage (i.e., NeilMed) daily prior to medicated nasal sprays and throughout the day. - For thick post nasal drainage, start Mucinex D 1200 mg once daily. Can increase to twice daily as needed with adequate hydration as discussed. - Add Azelastine 0.1% one to two sprays each nostril twice daily  Seasonal and perennial allergic rhinitis - Please continue Xhance two sprays per nostril once daily. - Increase nasal saline irrigation as above  Moderate persistent asthma, uncomplicated - Will not be able to start [place] today. Plan to start next week. - Daily controller medication(s): Continue Breo 200/25 one puff once daily + Singulair (montelukast) 10mg  daily - Rescue medications: ProAir 4 puffs every 4-6 hours as needed - Asthma control goals:  * Full participation in all desired activities (may need albuterol before activity) * Albuterol use two time or less a week on average (not counting use with activity) * Cough interfering with sleep two time or less a month * Oral steroids no more than once a year * No  hospitalizations  Follow-up 4-6 months or sooner  Zada Finders, MD Internal Medicine PGY-3  I appreciate the opportunity to take part in Maurice Richards's care. Please do not hesitate to contact me with questions.  Sincerely,   Prudy Feeler, MD Allergy/Immunology [facility] [place] [facility] [place]

## 2016-10-29 NOTE — Patient Instructions (Addendum)
Acute sinusitis - Appears to be resolving with your antibiotics and nasal steroid spray - Please finish your antibiotics: Augmentin 875mg  twice daily for two weeks; last does today. - Increase nasal saline spray (i.e., Simply Saline) or nasal saline lavage (i.e., NeilMed) daily prior to medicated nasal sprays and throughout the day. - For thick post nasal drainage, start Mucinex D 1200 mg once daily. Can increase to twice daily as needed with adequate hydration as discussed. - Add Azelastine 0.1% one to two sprays each nostril twice daily  Seasonal and perennial allergic rhinitis - Please continue Xhance two sprays per nostril once daily. - Increase nasal saline irrigation as above  Moderate persistent asthma, uncomplicated - Will not be able to start [place] today. Plan to start next week. - Daily controller medication(s): Continue Breo 200/25 one puff once daily + Singulair (montelukast) 10mg  daily - Rescue medications: ProAir 4 puffs every 4-6 hours as needed - Asthma control goals:  * Full participation in all desired activities (may need albuterol before activity) * Albuterol use two time or less a week on average (not counting use with activity) * Cough interfering with sleep two time or less a month * Oral steroids no more than once a year * No hospitalizations

## 2016-11-02 ENCOUNTER — Ambulatory Visit (INDEPENDENT_AMBULATORY_CARE_PROVIDER_SITE_OTHER): Payer: BLUE CROSS/BLUE SHIELD

## 2016-11-02 DIAGNOSIS — J4550 Severe persistent asthma, uncomplicated: Secondary | ICD-10-CM

## 2016-11-02 MED ORDER — EPINEPHRINE 0.3 MG/0.3ML IJ SOAJ: 0.3000 mg | Freq: Once | INTRAMUSCULAR | 2 refills | Status: AC

## 2016-11-02 MED ORDER — BENRALIZUMAB 30 MG/ML ~~LOC~~ SOSY
30.0000 mg | PREFILLED_SYRINGE | SUBCUTANEOUS | Status: AC
  Administered 2016-11-02 – 2021-12-09 (×30): 30 mg via SUBCUTANEOUS

## 2016-11-02 NOTE — Progress Notes (Signed)
Immunotherapy   Patient Details  Name: Maurice Richards MRN: [id number] Date of Birth: November 16, 1955  11/02/2016  Maurice Richards started injections for  Fasenra  Frequency: Every for 4 weeks for 3 doses and then every 8 weeks Epi-Pen:Prescription for Epi-Pen given Consent signed and patient instructions given.   [name] 11/02/2016, 10:38 AM

## 2016-11-04 NOTE — Progress Notes (Signed)
We received the outside records from [facility] (Dr. Annitta Needs) and Pulmonologist (Dr. Kem Parkinson).   Oncology workup: PDGFR alpha mutation, PDGFR beta mutation, BCR/ABL mutation, FGFR1 mutation, and pTM1-JAK2 mutational analyses (all normal). She felt that his hypereosinophilia was secondary to uncontrolled atopic disease. Otherwise, detailed in the clinic note.   Pulmonology workup: full pulmonary function testing (scan below). Otherwise, detailed in the clinic note.   We will send the records to be scanned into Epic.     Maurice Marvel, MD [facility] [place]

## 2016-11-11 ENCOUNTER — Ambulatory Visit (INDEPENDENT_AMBULATORY_CARE_PROVIDER_SITE_OTHER): Payer: BLUE CROSS/BLUE SHIELD | Admitting: Allergy & Immunology

## 2016-11-11 ENCOUNTER — Encounter: Payer: Self-pay | Admitting: Allergy & Immunology

## 2016-11-11 VITALS — BP 110/82 | HR 60 | Temp 97.7°F | Resp 20 | Ht 71.0 in | Wt 219.0 lb

## 2016-11-11 DIAGNOSIS — J302 Other seasonal allergic rhinitis: Secondary | ICD-10-CM

## 2016-11-11 DIAGNOSIS — J4550 Severe persistent asthma, uncomplicated: Secondary | ICD-10-CM

## 2016-11-11 DIAGNOSIS — J3089 Other allergic rhinitis: Secondary | ICD-10-CM

## 2016-11-11 MED ORDER — FLUTICASONE FUROATE-VILANTEROL 200-25 MCG/INH IN AEPB: 1.0000 | INHALATION_SPRAY | Freq: Every day | RESPIRATORY_TRACT | 5 refills | Status: DC

## 2016-11-11 MED ORDER — FLUTICASONE PROPIONATE 93 MCG/ACT NA EXHU: 2.0000 | INHALANT_SUSPENSION | Freq: Two times a day (BID) | NASAL | 5 refills | Status: DC

## 2016-11-11 MED ORDER — CARBINOXAMINE MALEATE 6 MG PO TABS: 6.0000 mg | ORAL_TABLET | Freq: Four times a day (QID) | ORAL | 5 refills | Status: DC

## 2016-11-11 MED ORDER — ALBUTEROL SULFATE HFA 108 (90 BASE) MCG/ACT IN AERS: 4.0000 | INHALATION_SPRAY | Freq: Four times a day (QID) | RESPIRATORY_TRACT | 2 refills | Status: DC | PRN

## 2016-11-11 MED ORDER — AZELASTINE HCL 0.1 % NA SOLN: 1.0000 | Freq: Every day | NASAL | 5 refills | Status: DC

## 2016-11-11 NOTE — Patient Instructions (Addendum)
1. Severe persistent asthma, uncomplicated - Lung testing was completely normal today. - We will continue with all of your asthma medications except for Singulair (montelukast). - Stop the montelukast once you are out of it and see how you do.  - Daily controller medication(s): Breo 200/25 one puff once daily + Fasenra monthly - Prior to physical activity: ProAir 2 puffs 10-15 minutes before physical activity. - Rescue medications: ProAir 4 puffs every 4-6 hours as needed or albuterol nebulizer one vial puffs every 4-6 hours as needed - Changes during respiratory infections or worsening symptoms: Add on Qvar 39mcg 2 puffs three times daily for ONE TO TWO WEEKS. - Asthma control goals:  * Full participation in all desired activities (may need albuterol before activity) * Albuterol use two time or less a week on average (not counting use with activity) * Cough interfering with sleep two time or less a month * Oral steroids no more than once a year * No hospitalizations  2. Seasonal and perennial allergic rhinitis  - Continue with Xhance 2 sprays per nostril daily. - Continue with Asteline 1-2 sprays per nostril daily. - Stop the cetirizine (green bottle) and start Ryvent one tablet every 6 hours as needed. - Prescription and copay card provided. - Consider allergy shots in the future.   3. Return in about 2 months (around 01/11/2017).   Please inform us of any Emergency Department visits, hospitalizations, or changes in symptoms. Call us before going to the ED for breathing or allergy symptoms since we might be able to fit you in for a sick visit. Feel free to contact us anytime with any questions, problems, or concerns.  It was a pleasure to see you again today! Enjoy the upcoming fall season!  Websites that have reliable patient information: 1. American Academy of Asthma, Allergy, and Immunology: www.aaaai.org 2. Food Allergy Research and Education (FARE): foodallergy.org 3. Mothers  of Asthmatics: http://www.asthmacommunitynetwork.org 4. American College of Allergy, Asthma, and Immunology: www.acaai.org   Election Day is coming up on Tuesday, November 6th! Make your voice heard! Register to vote at [email or web address]!     The last day to register is October 12th!   You can check the status of your registration by looking it up at [email or web address].  [place]: [email or web address]  [facility] of Elections:  [email or web address]

## 2016-11-11 NOTE — Progress Notes (Signed)
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 Severe persistent asthma, uncomplicated - Lung testing was completely normal today. - We will continue with all of your asthma medications except for Singulair (montelukast). - Stop the montelukast once you are out of it and see how you do.  - Daily controller medication(s): Breo 200/25 one puff once daily + Fasenra monthly - Prior to physical activity: ProAir 2 puffs 10-15 minutes before physical activity. - Rescue medications: ProAir 4 puffs every 4-6 hours as needed or albuterol nebulizer one vial puffs every 4-6 hours as needed - Changes during respiratory infections or worsening symptoms: Add on Qvar 69mcg 2 puffs three times daily for ONE TO TWO WEEKS. - Asthma control goals:  * Full participation in all desired activities (may need albuterol before activity) * Albuterol use two time or less a week on average (not counting use with activity) * Cough interfering with sleep two time or less a month * Oral steroids no more than once a year * No hospitalizations  2. Seasonal and perennial allergic rhinitis  - Continue with Xhance 2 sprays per nostril daily. - Continue with Asteline 1-2 sprays per nostril daily. - Stop the cetirizine (green bottle) and start Ryvent one tablet every 6 hours as needed. - Prescription and copay card provided. - Consider allergy shots in the future.   3. Return in about 2 months (around 01/11/2017).  Subjective:   Maurice Richards is a 61 y.o. male presenting today for follow up of  Chief Complaint  Patient presents with  . Nasal Congestion  . Headache    Maurice Richards has a history of the following: Patient Active Problem List   Diagnosis Date Noted  . Seasonal and perennial allergic rhinitis 10/15/2016  .  Moderate persistent asthma, uncomplicated 78/46/9629  . Third degree hemorrhoids   . Reflux esophagitis   . Hiatal hernia   . GERD (gastroesophageal reflux disease) 12/31/2014  . Dysphagia 12/31/2014  . History of colonic polyps 12/31/2014    History obtained from: chart review and patient.  Maurice Richards's Primary Care Provider is [name], MD.     Maurice Richards is a 61 y.o. male presenting for a follow up visit. He was last seen at the end of September 2018 by Dr. Nelva Bush for a sick visit. At that time, he was reporting continued nasal congestion and discharge. He was already on Augmentin and according to Dr. Nelva Bush he was improving. She recommended that he continue to take the Augmentin and complete the entire course. Astelin was added to his treatment regimen and he was continued on Xhance. He did finally start his [place] on 11/02/16. We did get all of his outside records, and these were summarized in the visit note from 10/14/16.   Since the last visit, he reports that he has mostly done well. He did not feel a marked improvement on the Faserna, but overall he does report feeling better than the time I saw him one month ago. He remains on the [facility] and is very compliant with this. He has not needed his rescue inhaler more than 1-2 times since the last visit. ACT today is slightly low at 13, but it is higher than when I first saw him.   His sinus pain and pressure has improved. He remains on the [place]  as well as the Astelin. He does not like the taste of the Astelin at all. He continues to complain about postnasal drip and mucous in his throat. He is currently on the cetirizine, which is he is getting from [employer]. He has been on this for years and is unsure whether he has tried some other antihistamines. He is open to trying other medications. He has also been on Singulair for 2-3 years, and is unsure whether this is helping out.  Otherwise, there have been no changes to his past medical history,  surgical history, family history, or social history.    Review of Systems: a 14-point review of systems is pertinent for what is mentioned in HPI.  Otherwise, all other systems were negative. Constitutional: negative other than that listed in the HPI Eyes: negative other than that listed in the HPI Ears, nose, mouth, throat, and face: negative other than that listed in the HPI Respiratory: negative other than that listed in the HPI Cardiovascular: negative other than that listed in the HPI Gastrointestinal: negative other than that listed in the HPI Genitourinary: negative other than that listed in the HPI Integument: negative other than that listed in the HPI Hematologic: negative other than that listed in the HPI Musculoskeletal: negative other than that listed in the HPI Neurological: negative other than that listed in the HPI Allergy/Immunologic: negative other than that listed in the HPI    Objective:   Blood pressure 110/82, pulse 60, temperature 97.7 F (36.5 C), temperature source Oral, resp. rate 20, height 5\' 11"  (1.803 m), weight 219 lb (99.3 kg), SpO2 95 %. Body mass index is 30.54 kg/m.   Physical Exam:  General: Alert, interactive, in no acute distress. Very pleasant male. Talkative per usual .Very gregarious. Eyes: No conjunctival injection bilaterally, no discharge on the right, no discharge on the left and no Horner-Trantas dots present. PERRL bilaterally. EOMI without pain. No photophobia.  Ears: Right TM pearly gray with normal light reflex, Left TM pearly gray with normal light reflex, Right TM intact without perforation and Left TM intact without perforation.  Nose/Throat: External nose within normal limits and septum midline. Turbinates edematous and pale with clear discharge. Posterior oropharynx erythematous without cobblestoning in the posterior oropharynx. Tonsils 2+ without exudates.  Tongue without thrush. Adenopathy: shoddy bilateral anterior cervical  lymphadenopathy Lungs: Clear to auscultation without wheezing, rhonchi or rales. No increased work of breathing. CV: Normal S1/S2. No murmurs. Capillary refill <2 seconds.  Skin: Warm and dry, without lesions or rashes. Neuro:   Grossly intact. No focal deficits appreciated. Responsive to questions.  Diagnostic studies:   Spirometry: results normal (FEV1: 3.15/84%, FVC: 4.06/82%, FEV1/FVC: 78%).    Spirometry consistent with normal pattern. Compared to the last visit, his spirometry was improved markedly. His FEV1 increased from 2.22 to 3.15 today and his FVC increased from 3.55 to 4.06 today.    Allergy Studies: none     Salvatore Marvel, MD [facility] [place]

## 2016-11-30 ENCOUNTER — Ambulatory Visit: Payer: Self-pay

## 2016-12-06 ENCOUNTER — Ambulatory Visit (INDEPENDENT_AMBULATORY_CARE_PROVIDER_SITE_OTHER): Payer: Self-pay | Admitting: *Deleted

## 2016-12-06 DIAGNOSIS — J4550 Severe persistent asthma, uncomplicated: Secondary | ICD-10-CM

## 2016-12-28 ENCOUNTER — Other Ambulatory Visit: Payer: Self-pay | Admitting: Allergy & Immunology

## 2017-01-03 ENCOUNTER — Ambulatory Visit (INDEPENDENT_AMBULATORY_CARE_PROVIDER_SITE_OTHER): Payer: Self-pay | Admitting: *Deleted

## 2017-01-03 DIAGNOSIS — J4550 Severe persistent asthma, uncomplicated: Secondary | ICD-10-CM

## 2017-01-27 ENCOUNTER — Encounter: Payer: Self-pay | Admitting: Allergy & Immunology

## 2017-01-27 ENCOUNTER — Ambulatory Visit (INDEPENDENT_AMBULATORY_CARE_PROVIDER_SITE_OTHER): Payer: Self-pay | Admitting: Allergy & Immunology

## 2017-01-27 VITALS — BP 110/78 | HR 70 | Resp 18

## 2017-01-27 DIAGNOSIS — D721 Eosinophilia, unspecified: Secondary | ICD-10-CM | POA: Insufficient documentation

## 2017-01-27 DIAGNOSIS — J3089 Other allergic rhinitis: Secondary | ICD-10-CM

## 2017-01-27 DIAGNOSIS — J4550 Severe persistent asthma, uncomplicated: Secondary | ICD-10-CM

## 2017-01-27 DIAGNOSIS — J302 Other seasonal allergic rhinitis: Secondary | ICD-10-CM

## 2017-01-27 NOTE — Progress Notes (Signed)
FOLLOW UP  Date of Service/Encounter:  01/27/17   Assessment:   Severe persistent asthma - stable on Fasenra  Eosinophilia  Seasonal and perennial allergic rhinitis   Asthma Reportables:  Severity: severe persistent  Risk: high Control: well controlled (as long as he is compliant with his medications)  Plan/Recommendations:   1. Severe persistent asthma - improved on Fasenra  - Lung testing was completely slightly worse today, but this was likely because you have been off of your Breo.   - I think you need to restart your controller medication (i.e. Breo), but we will decrease the dose to 100/25 to see if this can decrease the sores in your mouth. - Be sure to wash your mouth out well after each dose.  - Daily controller medication(s): Breo 100/25 one puff once daily at night + Fasenra every 8 weeks - Prior to physical activity: ProAir 2 puffs 10-15 minutes before physical activity. - Rescue medications: ProAir 4 puffs every 4-6 hours as needed or albuterol nebulizer one vial puffs every 4-6 hours as needed - Changes during respiratory infections or worsening symptoms: Add on Qvar 74mcg 2 puffs three times daily for ONE TO TWO WEEKS. - Asthma control goals:  * Full participation in all desired activities (may need albuterol before activity) * Albuterol use two time or less a week on average (not counting use with activity) * Cough interfering with sleep two time or less a month * Oral steroids no more than once a year * No hospitalizations  2. Seasonal and perennial allergic rhinitis  - Continue with Xhance 2 sprays per nostril in the morning. - STOP the Astelin 1-2 sprays per nostril daily. - STOP the Ryvent (carbinoxamine).   - Restart the Zyrtec (cetirizine; which you get from [facility]).  - Consider allergy shots in the future.   3. Return in about 2 months (around 03/30/2017).    Subjective:   Maurice Richards is a 61 y.o. male presenting today for follow up of    Chief Complaint  Patient presents with  . Asthma    still having some cough, not nearly as bad though.     Maurice Richards has a history of the following: Patient Active Problem List   Diagnosis Date Noted  . Severe persistent asthma, uncomplicated 60/11/9321  . Eosinophilia 01/27/2017  . Seasonal and perennial allergic rhinitis 10/15/2016  . Moderate persistent asthma, uncomplicated 55/73/2202  . Third degree hemorrhoids   . Reflux esophagitis   . Hiatal hernia   . GERD (gastroesophageal reflux disease) 12/31/2014  . Dysphagia 12/31/2014  . History of colonic polyps 12/31/2014    History obtained from: chart review and patient.  Maurice Richards's Primary Care Provider is [name], MD.     Maurice Richards is a 61 y.o. male presenting for a follow up visit. Maurice Richards was last seen in October 2018. At that time, lung testing was completely normal. We ended up stopping his Singulair to see how he did on the [place] only. For his PSAR, we continued with Xhance two sprays per nostril daily as well as Astelin 1-2 sprays per nostril daily. We changed him from cetirizine to Ryvent. He has a history of eosinophilia, for which he has had a thorough workup which has been normal. I have always felt that his eosinophilia was secondary to uncontrolled atopy.   Since the last visit, he has mostly done well. He did stop the [facility] since it was causing blisters inside  of his mouth. He was trying to rinse well. He does report wheezing at night time.   He continues to have some excessive mucous production. He did try changing to Lactaid since he felt it was an allergy. This might have helped somewhat. He does continue on his GERD medication (Protonix). The medications that he is taking for the most part include the azelastine and the Ryvent. He is unsure whether the Ryvent is helping at all. He thinks that the low spots and the increased water have made the season a lot worse with increased mold  spores.  Despite all of these complaints today, he is actually doing quite well. Symptoms have truly stabilized on the [place], and compared to where he was when he re-established care earlier in September 2018. He is in the process of getting new insurance based in [place], as he is going to be living predominantly here rather than [place]. He is not interested in pursuing allergy shots at this time since his insurance does not cover it well. However, he might take up allergy shots once he has his new insurance.   Otherwise, there have been no changes to his past medical history, surgical history, family history, or social history.    Review of Systems: a 14-point review of systems is pertinent for what is mentioned in HPI.  Otherwise, all other systems were negative. Constitutional: negative other than that listed in the HPI Eyes: negative other than that listed in the HPI Ears, nose, mouth, throat, and face: negative other than that listed in the HPI Respiratory: negative other than that listed in the HPI Cardiovascular: negative other than that listed in the HPI Gastrointestinal: negative other than that listed in the HPI Genitourinary: negative other than that listed in the HPI Integument: negative other than that listed in the HPI Hematologic: negative other than that listed in the HPI Musculoskeletal: negative other than that listed in the HPI Neurological: negative other than that listed in the HPI Allergy/Immunologic: negative other than that listed in the HPI    Objective:   Blood pressure 110/78, pulse 70, resp. rate 18, SpO2 95 %. There is no height or weight on file to calculate BMI.   Physical Exam:  General: Alert, interactive, in no acute distress. Pleasant boisterous male.  Eyes: No conjunctival injection bilaterally, no discharge on the right, no discharge on the left and no Horner-Trantas dots present. PERRL bilaterally. EOMI without pain. No  photophobia.  Ears: Right TM pearly gray with normal light reflex, Left TM pearly gray with normal light reflex, Right TM intact without perforation and Left TM intact without perforation.  Nose/Throat: External nose within normal limits and septum midline. Turbinates edematous and pale with clear discharge. Posterior oropharynx erythematous without cobblestoning in the posterior oropharynx. Tonsils 2+ without exudates.  Tongue without thrush. Adenopathy: no enlarged lymph nodes appreciated in the anterior cervical, occipital, axillary, epitrochlear, inguinal, or popliteal regions. Lungs: Decreased breath sounds bilaterally without wheezing, rhonchi or rales. No increased work of breathing. CV: Normal S1/S2. No murmurs. Capillary refill <2 seconds.  Skin: Warm and dry, without lesions or rashes. Neuro:   Grossly intact. No focal deficits appreciated. Responsive to questions.  Diagnostic studies:   Spirometry: results abnormal (FEV1: 2.64/70%, FVC: 3.59/73%, FEV1/FVC: 74%).    Spirometry consistent with possible restrictive disease.   Allergy Studies: none    Salvatore Marvel, MD [facility] [place]

## 2017-01-27 NOTE — Patient Instructions (Addendum)
1. Severe persistent asthma - improved on Fasenra  - Lung testing was completely slightly worse today, but this was likely because you have been off of your Breo.   - I think you need to restart your controller medication (i.e. Breo), but we will decrease the dose to 100/25 to see if this can decrease the sores in your mouth. - Be sure to wash your mouth out well after each dose.  - Daily controller medication(s): Breo 100/25 one puff once daily at night + Fasenra every 8 weeks - Prior to physical activity: ProAir 2 puffs 10-15 minutes before physical activity. - Rescue medications: ProAir 4 puffs every 4-6 hours as needed or albuterol nebulizer one vial puffs every 4-6 hours as needed - Changes during respiratory infections or worsening symptoms: Add on Qvar 76mcg 2 puffs three times daily for ONE TO TWO WEEKS. - Asthma control goals:  * Full participation in all desired activities (may need albuterol before activity) * Albuterol use two time or less a week on average (not counting use with activity) * Cough interfering with sleep two time or less a month * Oral steroids no more than once a year * No hospitalizations  2. Seasonal and perennial allergic rhinitis  - Continue with Xhance 2 sprays per nostril in the morning. - STOP the Astelin 1-2 sprays per nostril daily. - STOP the Ryvent (carbinoxamine).   - Restart the Zyrtec (cetirizine; which you get from [facility]).  - Consider allergy shots in the future.   3. Return in about 2 months (around 03/30/2017).   Please inform us of any Emergency Department visits, hospitalizations, or changes in symptoms. Call us before going to the ED for breathing or allergy symptoms since we might be able to fit you in for a sick visit. Feel free to contact us anytime with any questions, problems, or concerns.  It was a pleasure to see you again today! Enjoy the holiday season!  Websites that have reliable patient information: 1. American Academy of  Asthma, Allergy, and Immunology: www.aaaai.org 2. Food Allergy Research and Education (FARE): foodallergy.org 3. Mothers of Asthmatics: http://www.asthmacommunitynetwork.org 4. American College of Allergy, Asthma, and Immunology: www.acaai.org

## 2017-02-03 DIAGNOSIS — D721 Eosinophilia: Secondary | ICD-10-CM | POA: Diagnosis not present

## 2017-02-03 DIAGNOSIS — Z1389 Encounter for screening for other disorder: Secondary | ICD-10-CM | POA: Diagnosis not present

## 2017-02-03 DIAGNOSIS — E748 Other specified disorders of carbohydrate metabolism: Secondary | ICD-10-CM | POA: Diagnosis not present

## 2017-02-03 DIAGNOSIS — J329 Chronic sinusitis, unspecified: Secondary | ICD-10-CM | POA: Diagnosis not present

## 2017-02-03 DIAGNOSIS — E663 Overweight: Secondary | ICD-10-CM | POA: Diagnosis not present

## 2017-02-03 DIAGNOSIS — Z6828 Body mass index (BMI) 28.0-28.9, adult: Secondary | ICD-10-CM | POA: Diagnosis not present

## 2017-02-03 DIAGNOSIS — D72829 Elevated white blood cell count, unspecified: Secondary | ICD-10-CM | POA: Diagnosis not present

## 2017-02-03 DIAGNOSIS — K219 Gastro-esophageal reflux disease without esophagitis: Secondary | ICD-10-CM | POA: Diagnosis not present

## 2017-02-04 ENCOUNTER — Encounter: Payer: Self-pay | Admitting: Internal Medicine

## 2017-02-17 ENCOUNTER — Telehealth: Payer: Self-pay | Admitting: Allergy and Immunology

## 2017-02-17 NOTE — Telephone Encounter (Signed)
error 

## 2017-02-28 ENCOUNTER — Ambulatory Visit: Payer: Self-pay

## 2017-02-28 ENCOUNTER — Ambulatory Visit (INDEPENDENT_AMBULATORY_CARE_PROVIDER_SITE_OTHER): Payer: BLUE CROSS/BLUE SHIELD | Admitting: *Deleted

## 2017-02-28 DIAGNOSIS — J4550 Severe persistent asthma, uncomplicated: Secondary | ICD-10-CM | POA: Diagnosis not present

## 2017-03-14 DIAGNOSIS — R944 Abnormal results of kidney function studies: Secondary | ICD-10-CM | POA: Diagnosis not present

## 2017-03-29 ENCOUNTER — Ambulatory Visit: Payer: BLUE CROSS/BLUE SHIELD | Admitting: Allergy & Immunology

## 2017-03-29 ENCOUNTER — Encounter: Payer: Self-pay | Admitting: Allergy & Immunology

## 2017-03-29 VITALS — BP 112/68 | HR 74 | Resp 19

## 2017-03-29 DIAGNOSIS — D721 Eosinophilia, unspecified: Secondary | ICD-10-CM

## 2017-03-29 DIAGNOSIS — J302 Other seasonal allergic rhinitis: Secondary | ICD-10-CM

## 2017-03-29 DIAGNOSIS — J3089 Other allergic rhinitis: Secondary | ICD-10-CM | POA: Diagnosis not present

## 2017-03-29 DIAGNOSIS — J4550 Severe persistent asthma, uncomplicated: Secondary | ICD-10-CM

## 2017-03-29 MED ORDER — CARBINOXAMINE MALEATE 6 MG PO TABS: 6.0000 mg | ORAL_TABLET | Freq: Three times a day (TID) | ORAL | 5 refills | Status: DC | PRN

## 2017-03-29 MED ORDER — AMOXICILLIN-POT CLAVULANATE 875-125 MG PO TABS: 1.0000 | ORAL_TABLET | Freq: Two times a day (BID) | ORAL | 0 refills | Status: AC

## 2017-03-29 NOTE — Progress Notes (Signed)
FOLLOW UP  Date of Service/Encounter:  03/29/17   Assessment:   Severe persistent asthma - stable on Fasenra  Eosinophilia  Seasonal and perennial allergic rhinitis (trees, molds, dog, cat, dust mite)   Asthma Reportables:  Severity: severe persistent  Risk: high Control: well controlled   Plan/Recommendations:   1. Severe persistent asthma - improved on Fasenra  - Lung testing looked good today.  - I think we need to get you on a daily medication for your asthma: Symbicort - Daily controller medication(s): Symbicort 160/4.5 two puffs once daily at night + Fasenra every 8 weeks - Prior to physical activity: ProAir 2 puffs 10-15 minutes before physical activity. - Rescue medications: ProAir 4 puffs every 4-6 hours as needed or albuterol nebulizer one vial puffs every 4-6 hours as needed - Changes during respiratory infections or worsening symptoms: Increase Symbicort 160/4.68mcg 2 puffs twice daily for ONE TO TWO WEEKS. - Asthma control goals:  * Full participation in all desired activities (may need albuterol before activity) * Albuterol use two time or less a week on average (not counting use with activity) * Cough interfering with sleep two time or less a month * Oral steroids no more than once a year * No hospitalizations  2. Seasonal and perennial allergic rhinitis  - Continue with Xhance 2 sprays per nostril in the morning. - Restart the Ryvent 6mg  2-3 times daily as needed (can help with the sinus discharge and throat drippiness).  - Consider allergy shots in the future (information provided to contact insurance). - With your current symptoms and time course, antibiotics might be needed . - If symptoms are not improving in 1-2 days, feel free to call us or email me, at [email or web address] and we can send in an antibiotic at that time.   - Add on nasal saline spray (i.e., Simply Saline) or nasal saline lavage (i.e., NeilMed) as needed prior to medicated  nasal sprays.  - For thick post nasal drainage, add guaifenesin [phone] mg (Mucinex) twice daily as needed for mucous thinning with adequate hydration to help it work.   3. Return in about 3 months (around 06/26/2017).  Subjective:   Maurice Richards is a 62 y.o. male presenting today for follow up of  Chief Complaint  Patient presents with  . Asthma    Maurice Richards has a history of the following: Patient Active Problem List   Diagnosis Date Noted  . Severe persistent asthma, uncomplicated 67/01/4579  . Eosinophilia 01/27/2017  . Seasonal and perennial allergic rhinitis 10/15/2016  . Moderate persistent asthma, uncomplicated 99/83/3825  . Third degree hemorrhoids   . Reflux esophagitis   . Hiatal hernia   . GERD (gastroesophageal reflux disease) 12/31/2014  . Dysphagia 12/31/2014  . History of colonic polyps 12/31/2014    History obtained from: chart review and patient.  Maurice Richards's Primary Care Provider is Maurice Sites, MD.     Maurice Richards is a 62 y.o. male presenting for a follow up visit. He was last seen in December 2018. At that time, asthma was under good control with Berna Bue. Lung function was less than ideal, but I felt that this was secondary to his being off of his Breo. I recommended that he restart his Breo; we decreased the dose due a history of oral ulcerations. For his allergic rhinitis, we continued him on Xhance two sprays per nostril in the morning and restarted his cetirizine 10mg  daily. We stopped the Ryvent and the Astelin since these were  not providing much relief. I continued to recommend allergen immunotherapy, but his work schedule made this difficult.   Since the last visit, he has some rather intense symptoms with sinus issues including yellow discharge. He feels that it is "hanging in the trap" in his throat. He has rather intense drainage especially when he lays down at night. Overall symptoms have improved, but there has just never been any of the  medications that we have tried which have resulted in an overall feeling of completely wellness.   His postnasal drip has become such an issue that keeps a bottle by her bed in which to spit. He remains on his Xhance two sprays per nostril in the morning. He is also on the cetirizine 10mg  daily. He endorses problems with ringing ears and tooth pain. He would like to lay down without "gurgling", but he is not able to do this at this time. He did get a course of amoxicillin in January 2019 and he started feeling better on the antibiotic. Otherwise, he does not remember when the last time that he had antibiotics.   From an asthma perspective, he has done fairly well. Because of continued ulcers, he stopped using his Breo completely. He remains on the [place] every two months and overall he feels fairly good today. He has not been using his rescue medication at all.   Otherwise, there have been no changes to his past medical history, surgical history, family history, or social history. 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     Review of Systems: a 14-point review of systems is pertinent for what is mentioned in HPI.  Otherwise, all other systems were negative. Constitutional: negative other than that listed in the HPI Eyes: negative other than that listed in the HPI Ears, nose, mouth, throat, and face: negative other than that listed in the HPI Respiratory: negative other than that listed in the HPI Cardiovascular: negative other than that listed in the HPI Gastrointestinal: negative other than that listed in the HPI Genitourinary: negative other than that listed in the HPI Integument: negative other than that listed in the HPI Hematologic: negative other than that listed in the HPI Musculoskeletal: negative other than that listed in the HPI Neurological: negative other than that listed in the HPI Allergy/Immunologic: negative  other than that listed in the HPI    Objective:   Blood pressure 112/68, pulse 74, resp. rate 19, SpO2 94 %. There is no height or weight on file to calculate BMI.   Physical Exam:  General: Alert, interactive, in no acute distress. Talkative.  Eyes: No conjunctival injection bilaterally, no discharge on the right, no discharge on the left and no Horner-Trantas dots present. PERRL bilaterally. EOMI without pain. No photophobia.  Ears: Right TM pearly gray with normal light reflex, Left TM pearly gray with normal light reflex, Right TM intact without perforation and Left TM intact without perforation.  Nose/Throat: External nose within normal limits and septum midline. Turbinates edematous and pale with clear discharge. Posterior oropharynx erythematous with cobblestoning in the posterior oropharynx. Tonsils 2+ without exudates.  Tongue without thrush. Lungs: Clear to auscultation without wheezing, rhonchi or rales. No increased work of breathing. CV: Normal S1/S2. No murmurs. Capillary refill <2 seconds.  Skin: Warm and dry, without lesions or rashes. Neuro:   Grossly intact. No focal deficits appreciated. Responsive to questions.  Diagnostic studies:   Spirometry: results normal (FEV1: 2.72/81%,  FVC: 3.64/76%, FEV1/FVC: 74%).    Spirometry consistent with possible restrictive disease.   Allergy Studies: none     Salvatore Marvel, MD [facility] [place]

## 2017-03-29 NOTE — Patient Instructions (Addendum)
1. Severe persistent asthma - improved on Fasenra  - Lung testing looked good today.  - I think we need to get you on a daily medication for your asthma: Symbicort - Daily controller medication(s): Symbicort 160/4.5 two puffs once daily at night + Fasenra every 8 weeks - Prior to physical activity: ProAir 2 puffs 10-15 minutes before physical activity. - Rescue medications: ProAir 4 puffs every 4-6 hours as needed or albuterol nebulizer one vial puffs every 4-6 hours as needed - Changes during respiratory infections or worsening symptoms: Increase Symbicort 160/4.74mcg 2 puffs twice daily for ONE TO TWO WEEKS. - Asthma control goals:  * Full participation in all desired activities (may need albuterol before activity) * Albuterol use two time or less a week on average (not counting use with activity) * Cough interfering with sleep two time or less a month * Oral steroids no more than once a year * No hospitalizations  2. Seasonal and perennial allergic rhinitis  - Continue with Xhance 2 sprays per nostril in the morning. - Restart the Ryvent 6mg  2-3 times daily as needed (can help with the sinus discharge and throat drippiness).  - Consider allergy shots in the future (information provided to contact insurance). - With your current symptoms and time course, antibiotics might be needed . - If symptoms are not improving in 1-2 days, feel free to call us or email me, at [email or web address] and we can send in an antibiotic at that time.   - Add on nasal saline spray (i.e., Simply Saline) or nasal saline lavage (i.e., NeilMed) as needed prior to medicated nasal sprays.  - For thick post nasal drainage, add guaifenesin [phone] mg (Mucinex) twice daily as needed for mucous thinning with adequate hydration to help it work.   3. Return in about 3 months (around 06/26/2017).   Please inform us of any Emergency Department visits, hospitalizations, or changes in symptoms. Call us before going  to the ED for breathing or allergy symptoms since we might be able to fit you in for a sick visit. Feel free to contact us anytime with any questions, problems, or concerns.  It was a pleasure to see you again today!  Websites that have reliable patient information: 1. American Academy of Asthma, Allergy, and Immunology: www.aaaai.org 2. Food Allergy Research and Education (FARE): foodallergy.org 3. Mothers of Asthmatics: http://www.asthmacommunitynetwork.org 4. American College of Allergy, Asthma, and Immunology: www.acaai.org  Allergy Shots   Allergies are the result of a chain reaction that starts in the immune system. Your immune system controls how your body defends itself. For instance, if you have an allergy to pollen, your immune system identifies pollen as an invader or allergen. Your immune system overreacts by producing antibodies called Immunoglobulin E (IgE). These antibodies travel to cells that release chemicals, causing an allergic reaction.  The concept behind allergy immunotherapy, whether it is received in the form of shots or tablets, is that the immune system can be desensitized to specific allergens that trigger allergy symptoms. Although it requires time and patience, the payback can be long-term relief.  How Do Allergy Shots Work?  Allergy shots work much like a vaccine. Your body responds to injected amounts of a particular allergen given in increasing doses, eventually developing a resistance and tolerance to it. Allergy shots can lead to decreased, minimal or no allergy symptoms.  There generally are two phases: build-up and maintenance. Build-up often ranges from three to six months and involves receiving injections with increasing amounts of  the allergens. The shots are typically given once or twice a week, though more rapid build-up schedules are sometimes used.  The maintenance phase begins when the most effective dose is reached. This dose is different for each  person, depending on how allergic you are and your response to the build-up injections. Once the maintenance dose is reached, there are longer periods between injections, typically two to four weeks.  Occasionally doctors give cortisone-type shots that can temporarily reduce allergy symptoms. These types of shots are different and should not be confused with allergy immunotherapy shots.  Who Can Be Treated with Allergy Shots?  Allergy shots may be a good treatment approach for people with allergic rhinitis (hay fever), allergic asthma, conjunctivitis (eye allergy) or stinging insect allergy.   Before deciding to begin allergy shots, you should consider:  . The length of allergy season and the severity of your symptoms . Whether medications and/or changes to your environment can control your symptoms . Your desire to avoid long-term medication use . Time: allergy immunotherapy requires a major time commitment . Cost: may vary depending on your insurance coverage  Allergy shots for children age 12 and older are effective and often well tolerated. They might prevent the onset of new allergen sensitivities or the progression to asthma.  Allergy shots are not started on patients who are pregnant but can be continued on patients who become pregnant while receiving them. In some patients with other medical conditions or who take certain common medications, allergy shots may be of risk. It is important to mention other medications you talk to your allergist.   When Will I Feel Better?  Some may experience decreased allergy symptoms during the build-up phase. For others, it may take as long as 12 months on the maintenance dose. If there is no improvement after a year of maintenance, your allergist will discuss other treatment options with you.  If you aren't responding to allergy shots, it may be because there is not enough dose of the allergen in your vaccine or there are missing allergens that  were not identified during your allergy testing. Other reasons could be that there are high levels of the allergen in your environment or major exposure to non-allergic triggers like tobacco smoke.  What Is the Length of Treatment?  Once the maintenance dose is reached, allergy shots are generally continued for three to five years. The decision to stop should be discussed with your allergist at that time. Some people may experience a permanent reduction of allergy symptoms. Others may relapse and a longer course of allergy shots can be considered.  What Are the Possible Reactions?  The two types of adverse reactions that can occur with allergy shots are local and systemic. Common local reactions include very mild redness and swelling at the injection site, which can happen immediately or several hours after. A systemic reaction, which is less common, affects the entire body or a particular body system. They are usually mild and typically respond quickly to medications. Signs include increased allergy symptoms such as sneezing, a stuffy nose or hives.  Rarely, a serious systemic reaction called anaphylaxis can develop. Symptoms include swelling in the throat, wheezing, a feeling of tightness in the chest, nausea or dizziness. Most serious systemic reactions develop within 30 minutes of allergy shots. This is why it is strongly recommended you wait in your doctor's office for 30 minutes after your injections. Your allergist is trained to watch for reactions, and his or her staff  is trained and equipped with the proper medications to identify and treat them.  Who Should Administer Allergy Shots?  The preferred location for receiving shots is your prescribing allergist's office. Injections can sometimes be given at another facility where the physician and staff are trained to recognize and treat reactions, and have received instructions by your prescribing allergist.

## 2017-03-30 ENCOUNTER — Telehealth: Payer: Self-pay | Admitting: Allergy & Immunology

## 2017-03-30 MED ORDER — RYVENT 6 MG PO TABS: 6.0000 mg | ORAL_TABLET | Freq: Three times a day (TID) | ORAL | 5 refills | Status: DC | PRN

## 2017-03-30 NOTE — Telephone Encounter (Signed)
Pt came in yesterday and went to pharmacy to get meds Ryvent and it cost over $4000.00 and the Xhance. Was he not given a coupon and told that ins did not cover.  [facility] . [phone].

## 2017-03-30 NOTE — Telephone Encounter (Signed)
I sent the Ryvent again with the copay card attached. The Maurice Richards was sent correctly. I left a message for him advising him of this information.

## 2017-03-31 ENCOUNTER — Ambulatory Visit: Payer: Self-pay | Admitting: Allergy & Immunology

## 2017-04-01 ENCOUNTER — Telehealth: Payer: Self-pay | Admitting: Allergy & Immunology

## 2017-04-01 MED ORDER — RYVENT 6 MG PO TABS: 6.0000 mg | ORAL_TABLET | Freq: Three times a day (TID) | ORAL | 5 refills | Status: DC | PRN

## 2017-04-01 NOTE — Telephone Encounter (Signed)
Patient is calling back and said [facility] doesn't have the [facility]. He would like it sent to [facility] on [address] in [place]. He would like the copay card sent with it.

## 2017-04-01 NOTE — Telephone Encounter (Signed)
Patient is calling back, and has some questions about this prescription

## 2017-04-01 NOTE — Telephone Encounter (Signed)
I have advised patient that per his request I have sent the prescription to the [facility].

## 2017-04-05 DIAGNOSIS — M47812 Spondylosis without myelopathy or radiculopathy, cervical region: Secondary | ICD-10-CM | POA: Diagnosis not present

## 2017-04-05 DIAGNOSIS — F1120 Opioid dependence, uncomplicated: Secondary | ICD-10-CM | POA: Diagnosis not present

## 2017-04-05 DIAGNOSIS — Z79899 Other long term (current) drug therapy: Secondary | ICD-10-CM | POA: Diagnosis not present

## 2017-04-24 ENCOUNTER — Other Ambulatory Visit: Payer: Self-pay | Admitting: Internal Medicine

## 2017-05-03 ENCOUNTER — Ambulatory Visit: Payer: Self-pay

## 2017-05-06 ENCOUNTER — Ambulatory Visit (INDEPENDENT_AMBULATORY_CARE_PROVIDER_SITE_OTHER): Payer: BLUE CROSS/BLUE SHIELD

## 2017-05-06 DIAGNOSIS — J4550 Severe persistent asthma, uncomplicated: Secondary | ICD-10-CM | POA: Diagnosis not present

## 2017-06-30 DIAGNOSIS — M47812 Spondylosis without myelopathy or radiculopathy, cervical region: Secondary | ICD-10-CM | POA: Diagnosis not present

## 2017-06-30 DIAGNOSIS — Z79899 Other long term (current) drug therapy: Secondary | ICD-10-CM | POA: Diagnosis not present

## 2017-07-01 DIAGNOSIS — J4550 Severe persistent asthma, uncomplicated: Secondary | ICD-10-CM | POA: Diagnosis not present

## 2017-07-04 ENCOUNTER — Ambulatory Visit (INDEPENDENT_AMBULATORY_CARE_PROVIDER_SITE_OTHER): Payer: BLUE CROSS/BLUE SHIELD

## 2017-07-04 DIAGNOSIS — J4550 Severe persistent asthma, uncomplicated: Secondary | ICD-10-CM | POA: Diagnosis not present

## 2017-07-13 DIAGNOSIS — J82 Pulmonary eosinophilia, not elsewhere classified: Secondary | ICD-10-CM | POA: Diagnosis not present

## 2017-07-13 DIAGNOSIS — J45909 Unspecified asthma, uncomplicated: Secondary | ICD-10-CM | POA: Diagnosis not present

## 2017-07-13 DIAGNOSIS — D3500 Benign neoplasm of unspecified adrenal gland: Secondary | ICD-10-CM | POA: Diagnosis not present

## 2017-07-13 DIAGNOSIS — D721 Eosinophilia: Secondary | ICD-10-CM | POA: Diagnosis not present

## 2017-08-26 DIAGNOSIS — J4550 Severe persistent asthma, uncomplicated: Secondary | ICD-10-CM | POA: Diagnosis not present

## 2017-08-29 ENCOUNTER — Ambulatory Visit (INDEPENDENT_AMBULATORY_CARE_PROVIDER_SITE_OTHER): Payer: BLUE CROSS/BLUE SHIELD

## 2017-08-29 DIAGNOSIS — J4550 Severe persistent asthma, uncomplicated: Secondary | ICD-10-CM | POA: Diagnosis not present

## 2017-08-29 DIAGNOSIS — D225 Melanocytic nevi of trunk: Secondary | ICD-10-CM | POA: Diagnosis not present

## 2017-08-29 DIAGNOSIS — L821 Other seborrheic keratosis: Secondary | ICD-10-CM | POA: Diagnosis not present

## 2017-09-09 ENCOUNTER — Encounter: Payer: Self-pay | Admitting: Allergy & Immunology

## 2017-09-14 DIAGNOSIS — M79671 Pain in right foot: Secondary | ICD-10-CM | POA: Diagnosis not present

## 2017-09-14 DIAGNOSIS — M779 Enthesopathy, unspecified: Secondary | ICD-10-CM | POA: Diagnosis not present

## 2017-10-05 DIAGNOSIS — M47812 Spondylosis without myelopathy or radiculopathy, cervical region: Secondary | ICD-10-CM | POA: Diagnosis not present

## 2017-10-28 DIAGNOSIS — J4550 Severe persistent asthma, uncomplicated: Secondary | ICD-10-CM | POA: Diagnosis not present

## 2017-10-31 ENCOUNTER — Ambulatory Visit (INDEPENDENT_AMBULATORY_CARE_PROVIDER_SITE_OTHER): Payer: BLUE CROSS/BLUE SHIELD | Admitting: *Deleted

## 2017-10-31 DIAGNOSIS — J4550 Severe persistent asthma, uncomplicated: Secondary | ICD-10-CM | POA: Diagnosis not present

## 2017-11-02 DIAGNOSIS — M79671 Pain in right foot: Secondary | ICD-10-CM | POA: Diagnosis not present

## 2017-11-02 DIAGNOSIS — M779 Enthesopathy, unspecified: Secondary | ICD-10-CM | POA: Diagnosis not present

## 2017-11-30 ENCOUNTER — Encounter: Payer: Self-pay | Admitting: Gastroenterology

## 2017-11-30 ENCOUNTER — Encounter

## 2017-11-30 ENCOUNTER — Ambulatory Visit (INDEPENDENT_AMBULATORY_CARE_PROVIDER_SITE_OTHER): Payer: BLUE CROSS/BLUE SHIELD | Admitting: Gastroenterology

## 2017-11-30 VITALS — BP 143/91 | HR 76 | Temp 97.6°F | Ht 73.0 in | Wt 226.6 lb

## 2017-11-30 DIAGNOSIS — K219 Gastro-esophageal reflux disease without esophagitis: Secondary | ICD-10-CM | POA: Diagnosis not present

## 2017-11-30 DIAGNOSIS — K642 Third degree hemorrhoids: Secondary | ICD-10-CM

## 2017-11-30 MED ORDER — PANTOPRAZOLE SODIUM 40 MG PO TBEC: 40.0000 mg | DELAYED_RELEASE_TABLET | Freq: Every day | ORAL | 3 refills | Status: DC

## 2017-11-30 NOTE — Assessment & Plan Note (Signed)
Doing very well.  Continue pantoprazole 40 mg daily.  Reinforced antireflux measures.  Return to the office in 1 year or call sooner if needed.

## 2017-11-30 NOTE — Patient Instructions (Addendum)
1. Continue pantoprazole once daily before breakfast for reflux. 2. We will see you back in one year, call sooner if needed.

## 2017-11-30 NOTE — Progress Notes (Signed)
      Primary Care Physician: [name], MD  Primary Gastroenterologist:  Garfield Cornea, MD   Chief Complaint  Patient presents with  . Hemorrhoids    f/u. Very little rectal bleeding when he is constipated  . Gastroesophageal Reflux    f/u.     HPI: Maurice Richards is a 62 y.o. male here for follow up of GERD, hemorrhoids. He completed hemorrhoid banding last year with good response. Rarely has any issues with hemorrhoids. Rare brbpr if sits for prolonged period of time or strains. BM regular for the past one year. Reflux well controlled as long as he takes his pantoprazole. No dysphagia, abd pain, n/v, unintentional weight loss.   Current Outpatient Medications  Medication Sig Dispense Refill  . acetaminophen (TYLENOL) 500 MG tablet Take 500 mg by mouth every 6 (six) hours as needed. Reported on 05/30/2015    . albuterol (PROAIR HFA) 108 (90 Base) MCG/ACT inhaler Inhale 4 puffs into the lungs every 6 (six) hours as needed for wheezing or shortness of breath. 1 Inhaler 2  . oxyCODONE-acetaminophen (PERCOCET) 10-325 MG tablet TK 1 T PO BID PRN  0  . pantoprazole (PROTONIX) 40 MG tablet TAKE 1 TABLET BY MOUTH EVERY DAY 90 tablet 3  . Wheat Dextrin (BENEFIBER PO) Take by mouth as needed.      Current Facility-Administered Medications  Medication Dose Route Frequency Provider Last Rate Last Dose  . Benralizumab SOSY 30 mg  30 mg Subcutaneous Q28 days Valentina Shaggy, MD   30 mg at 10/31/17 1135    Allergies as of 11/30/2017  . (No Known Allergies)    ROS:  General: Negative for anorexia, weight loss, fever, chills, fatigue, weakness. ENT: Negative for hoarseness, difficulty swallowing , nasal congestion. CV: Negative for chest pain, angina, palpitations, dyspnea on exertion, peripheral edema.  Respiratory: Negative for dyspnea at rest, dyspnea on exertion, cough, sputum, wheezing.  GI: See history of present illness. GU:  Negative for dysuria, hematuria, urinary  incontinence, urinary frequency, nocturnal urination.  Endo: Negative for unusual weight change.    Physical Examination:   BP (!) 143/91   Pulse 76   Temp 97.6 F (36.4 C) (Oral)   Ht 6\' 1"  (1.854 m)   Wt 226 lb 9.6 oz (102.8 kg)   BMI 29.90 kg/m   General: Well-nourished, well-developed in no acute distress.  Eyes: No icterus. Mouth: Oropharyngeal mucosa moist and pink , no lesions erythema or exudate. Lungs: Clear to auscultation bilaterally.  Heart: Regular rate and rhythm, no murmurs rubs or gallops.  Abdomen: Bowel sounds are normal, nontender, nondistended, no hepatosplenomegaly or masses, no abdominal bruits or hernia , no rebound or guarding.   Extremities: No lower extremity edema. No clubbing or deformities. Neuro: Alert and oriented x 4   Skin: Warm and dry, no jaundice.   Psych: Alert and cooperative, normal mood and affect.

## 2017-11-30 NOTE — Assessment & Plan Note (Signed)
Fairly asymptomatic status post hemorrhoid banding last year.  Avoid straining.  Currently bowel movements are regular.  Call with any questions or concerns.

## 2017-12-01 NOTE — Progress Notes (Signed)
CC'D TO PCP °

## 2017-12-06 ENCOUNTER — Other Ambulatory Visit: Payer: Self-pay | Admitting: *Deleted

## 2017-12-06 MED ORDER — FLUTICASONE PROPIONATE 93 MCG/ACT NA EXHU: 2.0000 | INHALANT_SUSPENSION | Freq: Every day | NASAL | 0 refills | Status: DC

## 2017-12-07 ENCOUNTER — Telehealth: Payer: Self-pay

## 2017-12-07 NOTE — Telephone Encounter (Signed)
Pharmacy called to verify [facility] directions.  Verified that per Dr. Gillermina Hu note, patient is to use two sprays each nare every morning.

## 2017-12-26 ENCOUNTER — Ambulatory Visit: Payer: Self-pay

## 2018-01-02 ENCOUNTER — Ambulatory Visit (INDEPENDENT_AMBULATORY_CARE_PROVIDER_SITE_OTHER): Payer: BLUE CROSS/BLUE SHIELD | Admitting: *Deleted

## 2018-01-02 DIAGNOSIS — Z79899 Other long term (current) drug therapy: Secondary | ICD-10-CM | POA: Diagnosis not present

## 2018-01-02 DIAGNOSIS — M47812 Spondylosis without myelopathy or radiculopathy, cervical region: Secondary | ICD-10-CM | POA: Diagnosis not present

## 2018-01-02 DIAGNOSIS — I10 Essential (primary) hypertension: Secondary | ICD-10-CM | POA: Diagnosis not present

## 2018-01-02 DIAGNOSIS — J4550 Severe persistent asthma, uncomplicated: Secondary | ICD-10-CM

## 2018-01-02 DIAGNOSIS — F419 Anxiety disorder, unspecified: Secondary | ICD-10-CM | POA: Diagnosis not present

## 2018-01-03 DIAGNOSIS — J4550 Severe persistent asthma, uncomplicated: Secondary | ICD-10-CM | POA: Diagnosis not present

## 2018-01-05 ENCOUNTER — Other Ambulatory Visit: Payer: Self-pay | Admitting: Allergy & Immunology

## 2018-02-24 DIAGNOSIS — J4550 Severe persistent asthma, uncomplicated: Secondary | ICD-10-CM | POA: Diagnosis not present

## 2018-02-27 ENCOUNTER — Ambulatory Visit (INDEPENDENT_AMBULATORY_CARE_PROVIDER_SITE_OTHER): Payer: BLUE CROSS/BLUE SHIELD | Admitting: *Deleted

## 2018-02-27 DIAGNOSIS — J4550 Severe persistent asthma, uncomplicated: Secondary | ICD-10-CM

## 2018-02-28 ENCOUNTER — Ambulatory Visit: Payer: BLUE CROSS/BLUE SHIELD | Admitting: Allergy & Immunology

## 2018-03-01 ENCOUNTER — Ambulatory Visit: Payer: BLUE CROSS/BLUE SHIELD | Admitting: Allergy & Immunology

## 2018-03-01 ENCOUNTER — Encounter: Payer: Self-pay | Admitting: Allergy & Immunology

## 2018-03-01 VITALS — BP 134/100 | HR 61 | Resp 16 | Ht 73.0 in | Wt 226.0 lb

## 2018-03-01 DIAGNOSIS — J3089 Other allergic rhinitis: Secondary | ICD-10-CM | POA: Diagnosis not present

## 2018-03-01 DIAGNOSIS — J302 Other seasonal allergic rhinitis: Secondary | ICD-10-CM | POA: Diagnosis not present

## 2018-03-01 DIAGNOSIS — J4550 Severe persistent asthma, uncomplicated: Secondary | ICD-10-CM

## 2018-03-01 DIAGNOSIS — D721 Eosinophilia, unspecified: Secondary | ICD-10-CM

## 2018-03-01 NOTE — Patient Instructions (Addendum)
1. Severe persistent asthma - improved on Fasenra  - Lung testing looked good today.  - Continue with the daily Breo.  - Daily controller medication(s): Breo 200/25 one puff once daily + Fasenra every 8 weeks - Prior to physical activity: ProAir 2 puffs 10-15 minutes before physical activity. - Rescue medications: ProAir 4 puffs every 4-6 hours as needed or albuterol nebulizer one vial puffs every 4-6 hours as needed - Asthma control goals:  * Full participation in all desired activities (may need albuterol before activity) * Albuterol use two time or less a week on average (not counting use with activity) * Cough interfering with sleep two time or less a month * Oral steroids no more than once a year * No hospitalizations  2. Seasonal and perennial allergic rhinitis  - Continue with Xhance 2 sprays per nostril in the morning.  3. Return in about 6 months (around 08/30/2018).   Please inform us of any Emergency Department visits, hospitalizations, or changes in symptoms. Call us before going to the ED for breathing or allergy symptoms since we might be able to fit you in for a sick visit. Feel free to contact us anytime with any questions, problems, or concerns.  It was a pleasure to see you again today!  Websites that have reliable patient information: 1. American Academy of Asthma, Allergy, and Immunology: www.aaaai.org 2. Food Allergy Research and Education (FARE): foodallergy.org 3. Mothers of Asthmatics: http://www.asthmacommunitynetwork.org 4. American College of Allergy, Asthma, and Immunology: MonthlyElectricBill.co.uk   Make sure you are registered to vote! If you have moved or changed any of your contact information, you will need to get this updated before voting!    Voter ID laws are POSSIBLY going into effect for the General Election in November 2020! Be prepared! Check out [email or web address] for more details.

## 2018-03-01 NOTE — Progress Notes (Signed)
FOLLOW UP  Date of Service/Encounter:  03/01/18   Assessment:   Severe persistent asthma- stable on Fasenra  Eosinophilia  Seasonal and perennial allergic rhinitis (trees, molds, dog, cat, dust mite)   Asthma Reportables:  Severity: severe persistent  Risk: high Control: well controlled   Plan/Recommendations:   1. Severe persistent asthma - improved on Fasenra  - Lung testing looked good today.  - Continue with the daily Breo.  - Daily controller medication(s): Breo 200/25 one puff once daily + Fasenra every 8 weeks - Prior to physical activity: ProAir 2 puffs 10-15 minutes before physical activity. - Rescue medications: ProAir 4 puffs every 4-6 hours as needed or albuterol nebulizer one vial puffs every 4-6 hours as needed - Asthma control goals:  * Full participation in all desired activities (may need albuterol before activity) * Albuterol use two time or less a week on average (not counting use with activity) * Cough interfering with sleep two time or less a month * Oral steroids no more than once a year * No hospitalizations  2. Seasonal and perennial allergic rhinitis  - Continue with Xhance 2 sprays per nostril in the morning. - He had no indications for antibiotics at this time. - He was requesting a prescription for Augmentin "just in case".   3. Return in about 6 months (around 08/30/2018).     Subjective:   Maurice Richards is a 63 y.o. male presenting today for follow up of  Chief Complaint  Patient presents with  . Asthma  . Allergic Rhinitis     Maurice Richards has a history of the following: Patient Active Problem List   Diagnosis Date Noted  . Severe persistent asthma, uncomplicated 49/20/1007  . Eosinophilia 01/27/2017  . Seasonal and perennial allergic rhinitis 10/15/2016  . Moderate persistent asthma, uncomplicated 01/20/7587  . Third degree hemorrhoids   . Reflux esophagitis   . Hiatal hernia   . GERD (gastroesophageal reflux  disease) 12/31/2014  . Dysphagia 12/31/2014  . History of colonic polyps 12/31/2014    History obtained from: chart review and patient.  [name]'s Primary Care Provider is [name], MD.     [name] is a 63 y.o. male presenting for a follow up visit.  He was last seen in February 2019.  At that time, his lung testing looked great.  He was doing much better on Fasenra.  We added on Symbicort 2 puffs once daily with pro-air as needed.  For his seasonal and perennial allergic rhinitis, we continued Xhance 2 sprays per nostril in the morning and restarted RyVent 6 mg 2-3 times daily as needed.   Since the last visit, he has mostly done well. He remains on the [place] every 8 weeks.  He has about 4-5 Breo is at home, which she is not using at all.  He has not been using his rescue inhaler at all.  ACT score today is 25, indicating excellent asthma control.  He has needed no ER visits or hospitalizations for his symptoms.  He has had no problems with the Fasenra injections and is quite happy with how well he is doing on this.  He reports that he is doing 80 to 90% better than when I first saw him.  His rhinitis is actually well controlled.  He describes an episode around 1 year ago when he felt a parasite coming out of his nose.  He thought at first that it was mucus but then it was long  and stringy.  He inflicted into the trash can and was unable to find it again.  But he swears today that it was a parasite.  This occurred when he was cleaning some shelves with Clorox bleach.   In any case, he called his lung doctor in [facility] and went in to see him. Maurice Richards did not save the parasite for anyone else to see. He thinks that this is from his trip to [place] trip in March 2017.  He tells me a story today about his trip to [place] when he was cutting up coconuts with "bugs flying all over the place".  Apparently he is under the impression that 1 of these bugs entered his nostril and continue  to live there for about 9 months.  He thinks this is what caused his eosinophilia.  In any case, he has felt very good since this parasite or mucus strand escapes noticed.  He does have the Xhance on hand which he uses about once per day at the most, although sometimes he will skip this.  He is very happy with how this is making him feel.  He is not using any of his other medications whatsoever.  He did receive 1 course of antibiotics since I last saw him, but otherwise he is done very well.    Otherwise, there have been no changes to his past medical history, surgical history, family history, or social history.    Review of Systems: a 14-point review of systems is pertinent for what is mentioned in HPI.  Otherwise, all other systems were negative.  Constitutional: negative other than that listed in the HPI Eyes: negative other than that listed in the HPI Ears, nose, mouth, throat, and face: negative other than that listed in the HPI Respiratory: negative other than that listed in the HPI Cardiovascular: negative other than that listed in the HPI Gastrointestinal: negative other than that listed in the HPI Genitourinary: negative other than that listed in the HPI Integument: negative other than that listed in the HPI Hematologic: negative other than that listed in the HPI Musculoskeletal: negative other than that listed in the HPI Neurological: negative other than that listed in the HPI Allergy/Immunologic: negative other than that listed in the HPI    Objective:   Blood pressure (!) 134/100, pulse 61, resp. rate 16, height 6\' 1"  (1.854 m), weight 226 lb (102.5 kg), SpO2 95 %. Body mass index is 29.82 kg/m.   Physical Exam:  General: Alert, interactive, in no acute distress. Talkative.  Eyes: No conjunctival injection bilaterally, no discharge on the right, no discharge on the left and no Horner-Trantas dots present. PERRL bilaterally. EOMI without pain. No photophobia.  Ears:  Right TM pearly gray with normal light reflex, Left TM pearly gray with normal light reflex, Right TM intact without perforation and Left TM intact without perforation.  Nose/Throat: Clear discharge on the right side, External nose within normal limits and septum midline. Turbinates minimally edematous with clear discharge. Posterior oropharynx mildly erythematous without cobblestoning in the posterior oropharynx. Tonsils 2+ without exudates.  Tongue without thrush. Lungs: Clear to auscultation without wheezing, rhonchi or rales. No increased work of breathing. CV: Normal S1/S2. No murmurs. Capillary refill <2 seconds.  Skin: Warm and dry, without lesions or rashes. Neuro:   Grossly intact. No focal deficits appreciated. Responsive to questions.  Diagnostic studies:   Spirometry: results normal (FEV1: 2.74/78%, FVC: 3.42/68%, FEV1/FVC: 79%).    Spirometry consistent with possible restrictive disease,  but overall values are stable compared to previous spirometries.   Allergy Studies: none       Salvatore Marvel, MD  [facility] [place] [facility] [place]

## 2018-04-26 DIAGNOSIS — J4550 Severe persistent asthma, uncomplicated: Secondary | ICD-10-CM | POA: Diagnosis not present

## 2018-04-27 ENCOUNTER — Ambulatory Visit (INDEPENDENT_AMBULATORY_CARE_PROVIDER_SITE_OTHER): Payer: BLUE CROSS/BLUE SHIELD | Admitting: *Deleted

## 2018-04-27 DIAGNOSIS — J4550 Severe persistent asthma, uncomplicated: Secondary | ICD-10-CM | POA: Diagnosis not present

## 2018-04-27 MED ORDER — EPINEPHRINE 0.3 MG/0.3ML IJ SOAJ: 0.3000 mg | Freq: Once | INTRAMUSCULAR | 2 refills | Status: AC

## 2018-05-01 ENCOUNTER — Ambulatory Visit: Payer: Self-pay

## 2018-05-09 DIAGNOSIS — Z79899 Other long term (current) drug therapy: Secondary | ICD-10-CM | POA: Diagnosis not present

## 2018-05-09 DIAGNOSIS — R030 Elevated blood-pressure reading, without diagnosis of hypertension: Secondary | ICD-10-CM | POA: Diagnosis not present

## 2018-05-09 DIAGNOSIS — Z6830 Body mass index (BMI) 30.0-30.9, adult: Secondary | ICD-10-CM | POA: Diagnosis not present

## 2018-05-09 DIAGNOSIS — F419 Anxiety disorder, unspecified: Secondary | ICD-10-CM | POA: Diagnosis not present

## 2018-06-29 DIAGNOSIS — J4550 Severe persistent asthma, uncomplicated: Secondary | ICD-10-CM | POA: Diagnosis not present

## 2018-06-30 ENCOUNTER — Other Ambulatory Visit: Payer: Self-pay

## 2018-06-30 ENCOUNTER — Ambulatory Visit (INDEPENDENT_AMBULATORY_CARE_PROVIDER_SITE_OTHER): Payer: BLUE CROSS/BLUE SHIELD | Admitting: *Deleted

## 2018-06-30 DIAGNOSIS — J4550 Severe persistent asthma, uncomplicated: Secondary | ICD-10-CM

## 2018-07-18 DIAGNOSIS — E785 Hyperlipidemia, unspecified: Secondary | ICD-10-CM | POA: Diagnosis not present

## 2018-07-18 DIAGNOSIS — R109 Unspecified abdominal pain: Secondary | ICD-10-CM | POA: Diagnosis not present

## 2018-07-18 DIAGNOSIS — R7989 Other specified abnormal findings of blood chemistry: Secondary | ICD-10-CM | POA: Diagnosis not present

## 2018-07-18 DIAGNOSIS — F419 Anxiety disorder, unspecified: Secondary | ICD-10-CM | POA: Diagnosis not present

## 2018-07-18 DIAGNOSIS — K219 Gastro-esophageal reflux disease without esophagitis: Secondary | ICD-10-CM | POA: Diagnosis not present

## 2018-07-18 DIAGNOSIS — Z79899 Other long term (current) drug therapy: Secondary | ICD-10-CM | POA: Diagnosis not present

## 2018-07-18 DIAGNOSIS — Z125 Encounter for screening for malignant neoplasm of prostate: Secondary | ICD-10-CM | POA: Diagnosis not present

## 2018-08-30 DIAGNOSIS — J4550 Severe persistent asthma, uncomplicated: Secondary | ICD-10-CM

## 2018-08-31 ENCOUNTER — Other Ambulatory Visit: Payer: Self-pay

## 2018-08-31 ENCOUNTER — Ambulatory Visit (INDEPENDENT_AMBULATORY_CARE_PROVIDER_SITE_OTHER): Payer: BC Managed Care – PPO

## 2018-08-31 DIAGNOSIS — J4550 Severe persistent asthma, uncomplicated: Secondary | ICD-10-CM

## 2018-09-01 ENCOUNTER — Ambulatory Visit: Payer: Self-pay

## 2018-11-01 ENCOUNTER — Encounter: Payer: Self-pay | Admitting: Internal Medicine

## 2018-11-02 ENCOUNTER — Ambulatory Visit: Payer: Self-pay

## 2018-11-30 DIAGNOSIS — F419 Anxiety disorder, unspecified: Secondary | ICD-10-CM | POA: Diagnosis not present

## 2018-11-30 DIAGNOSIS — M47812 Spondylosis without myelopathy or radiculopathy, cervical region: Secondary | ICD-10-CM | POA: Diagnosis not present

## 2018-11-30 DIAGNOSIS — F1120 Opioid dependence, uncomplicated: Secondary | ICD-10-CM | POA: Diagnosis not present

## 2018-11-30 DIAGNOSIS — Z79899 Other long term (current) drug therapy: Secondary | ICD-10-CM | POA: Diagnosis not present

## 2018-12-04 ENCOUNTER — Encounter (HOSPITAL_COMMUNITY): Payer: Self-pay

## 2018-12-04 ENCOUNTER — Ambulatory Visit (HOSPITAL_COMMUNITY)
Admission: RE | Admit: 2018-12-04 | Discharge: 2018-12-04 | Disposition: A | Discharge status: Home / Self Care | Payer: BC Managed Care – PPO | Source: Ambulatory Visit | Attending: Internal Medicine | Admitting: Internal Medicine

## 2018-12-04 ENCOUNTER — Other Ambulatory Visit (HOSPITAL_COMMUNITY): Payer: Self-pay | Admitting: Internal Medicine

## 2018-12-04 ENCOUNTER — Other Ambulatory Visit: Payer: Self-pay

## 2018-12-04 DIAGNOSIS — S99922A Unspecified injury of left foot, initial encounter: Secondary | ICD-10-CM

## 2018-12-04 DIAGNOSIS — S92515A Nondisplaced fracture of proximal phalanx of left lesser toe(s), initial encounter for closed fracture: Secondary | ICD-10-CM | POA: Diagnosis not present

## 2018-12-22 DIAGNOSIS — R202 Paresthesia of skin: Secondary | ICD-10-CM | POA: Diagnosis not present

## 2018-12-22 DIAGNOSIS — R200 Anesthesia of skin: Secondary | ICD-10-CM | POA: Diagnosis not present

## 2018-12-22 DIAGNOSIS — G5753 Tarsal tunnel syndrome, bilateral lower limbs: Secondary | ICD-10-CM | POA: Diagnosis not present

## 2019-01-16 ENCOUNTER — Other Ambulatory Visit: Payer: Self-pay | Admitting: *Deleted

## 2019-01-16 MED ORDER — PANTOPRAZOLE SODIUM 40 MG PO TBEC: 40.0000 mg | DELAYED_RELEASE_TABLET | Freq: Every day | ORAL | 3 refills | Status: DC

## 2019-01-16 NOTE — Telephone Encounter (Signed)
Completed.

## 2019-01-16 NOTE — Telephone Encounter (Signed)
Received refill request for pantoprazole 40mg  1 tablet QD #90

## 2019-01-16 NOTE — Addendum Note (Signed)
Addended by: Annitta Needs on: 01/16/2019 12:46 PM   Modules accepted: Orders

## 2019-03-01 ENCOUNTER — Ambulatory Visit: Payer: 59 | Admitting: Allergy & Immunology

## 2019-03-01 ENCOUNTER — Ambulatory Visit: Payer: Self-pay | Admitting: Allergy & Immunology

## 2019-03-01 ENCOUNTER — Encounter: Payer: Self-pay | Admitting: Allergy & Immunology

## 2019-03-01 ENCOUNTER — Other Ambulatory Visit: Payer: Self-pay

## 2019-03-01 ENCOUNTER — Ambulatory Visit (INDEPENDENT_AMBULATORY_CARE_PROVIDER_SITE_OTHER): Payer: 59

## 2019-03-01 VITALS — BP 138/90 | HR 56 | Temp 97.2°F | Resp 16 | Ht 73.0 in | Wt 230.2 lb

## 2019-03-01 DIAGNOSIS — J3089 Other allergic rhinitis: Secondary | ICD-10-CM

## 2019-03-01 DIAGNOSIS — J4550 Severe persistent asthma, uncomplicated: Secondary | ICD-10-CM

## 2019-03-01 DIAGNOSIS — J302 Other seasonal allergic rhinitis: Secondary | ICD-10-CM

## 2019-03-01 DIAGNOSIS — D7219 Other eosinophilia: Secondary | ICD-10-CM

## 2019-03-01 NOTE — Progress Notes (Signed)
FOLLOW UP  Date of Service/Encounter:  03/01/19   Assessment:   Severe persistent asthma- stable on Fasenra (restarting Berna Bue today since it has been longer than 6 months)  Eosinophilia  Seasonal and perennial allergic rhinitis(trees, molds, dog, cat, dust mite)   Asthma Reportables: Severity:severe persistent Risk:high Control:well controlled    Plan/Recommendations:   1. Severe persistent asthma - improved on Fasenra  - Lung testing looked good today.  - We are not going to make any changes at this time since you are doing so well.  - Daily controller medication(s): Breo 200/25 one puff once daily + Fasenra every 8 weeks - Prior to physical activity: ProAir 2 puffs 10-15 minutes before physical activity. - Rescue medications: ProAir 4 puffs every 4-6 hours as needed or albuterol nebulizer one vial puffs every 4-6 hours as needed - Asthma control goals:  * Full participation in all desired activities (may need albuterol before activity) * Albuterol use two time or less a week on average (not counting use with activity) * Cough interfering with sleep two time or less a month * Oral steroids no more than once a year * No hospitalizations  2. Seasonal and perennial allergic rhinitis  - Continue with Xhance 2 sprays per nostril in the morning and night.  - Add the spray at night will help decrease the mucous production at night.   3. Return in about 6 months (around 08/29/2019). This can be an in-person, a virtual Webex or a telephone follow up visit.   Subjective:   Maurice Richards is a 64 y.o. male presenting today for follow up of  Chief Complaint  Patient presents with  . Asthma    yearly checkup  . Nasal Congestion    drainage that goes into his stomach, pooping mucous    Maurice Richards has a history of the following: Patient Active Problem List   Diagnosis Date Noted  . Severe persistent asthma, uncomplicated [id number]  . Eosinophilia  01/27/2017  . Seasonal and perennial allergic rhinitis 10/15/2016  . Moderate persistent asthma, uncomplicated [id number]  . Third degree hemorrhoids   . Reflux esophagitis   . Hiatal hernia   . GERD (gastroesophageal reflux disease) 12/31/2014  . Dysphagia 12/31/2014  . History of colonic polyps 12/31/2014    History obtained from: chart review and patient.  Maurice Richards is a 64 y.o. male presenting for a follow up visit.  He was last seen in January 2020.  At that time, his lung testing looked great.  We continued Breo 200/25 1 puff once daily in addition to [place].  We also continued albuterol 4 puffs every 4-6 hours as needed.  For his allergic rhinitis, we continued XHANCE 2 sprays up to twice daily.  At the last visit, he related the story of falling a parasite out of his nose.  He was under the impression that he caught the parasite during a trip to [place] in March 2017.  At that time, he was cutting up coconuts and one of the bugs when up his nose, which is where he thought the parasite originated.  In the interim, he has done well.  He has missed his Berna Bue for the last 6 months.  He tells me he cannot believe it has been that long. Despite that, he has had no problems with his asthma requiring prednisone or ED visits. He is on the [place] but he does not use it on a daily basis. He does not use his albuterol  all that much. He has been trying to limit his environmental exposures which has helped to control his breathing episodes.   From a rhinitis perspective he has done very well.  He does report having a lot of mucus in the morning that he has to spit up.  He uses his XHANCE in the morning only.  He has not tried doing it at night.  He does have Zyrtec which he does not use consistently.  He has not needed any antibiotics since last time we saw him.  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  He continues to spend  a lot of time at the beach.  Otherwise, there have been no changes to his past medical history, surgical history, family history, or social history.    ROS     Objective:   Blood pressure 138/90, pulse (!) 56, temperature (!) 97.2 F (36.2 C), temperature source Temporal, resp. rate 16, height 6\' 1"  (1.854 m), weight 230 lb 3.2 oz (104.4 kg), SpO2 97 %. Body mass index is 30.37 kg/m.   Physical Exam:  Physical Exam  Constitutional: He appears well-developed.  Talkative male. Very pleasant as always. Appreciative.   HENT:  Head: Normocephalic and atraumatic.  Right Ear: Tympanic membrane, external ear and ear canal normal.  Left Ear: Tympanic membrane, external ear and ear canal normal.  Nose: Mucosal edema and rhinorrhea present. No nasal deformity or septal deviation. No epistaxis. Right sinus exhibits no maxillary sinus tenderness and no frontal sinus tenderness. Left sinus exhibits no maxillary sinus tenderness and no frontal sinus tenderness.  Mouth/Throat: Uvula is midline and oropharynx is clear and moist. Mucous membranes are not pale and not dry.  There is some clear rhinorrhea bilaterally.  No purulent discharge.  No polyps appreciated  Eyes: Pupils are equal, round, and reactive to light. Conjunctivae and EOM are normal. Right eye exhibits no chemosis and no discharge. Left eye exhibits no chemosis and no discharge. Right conjunctiva is not injected. Left conjunctiva is not injected.  Cardiovascular: Normal rate, regular rhythm and normal heart sounds.  Respiratory: Effort normal and breath sounds normal. No accessory muscle usage. No tachypnea. No respiratory distress. He has no wheezes. He has no rhonchi. He has no rales. He exhibits no tenderness.  Faint wheezes at the bases, but moving air fairly well.  Lymphadenopathy:    He has no cervical adenopathy.  Neurological: He is alert.  Skin: No abrasion, no petechiae and no rash noted. Rash is not papular, not vesicular  and not urticarial. No erythema. No pallor.  No eczematous or urticarial lesions.  Psychiatric: He has a normal mood and affect.     Diagnostic studies:    Spirometry: results normal (FEV1: 2.68/72%, FVC: 33.75/79%, FEV1/FVC: 71%).    Spirometry consistent with normal pattern.   Allergy Studies: none      Salvatore Marvel, MD  [facility]

## 2019-03-01 NOTE — Patient Instructions (Addendum)
1. Severe persistent asthma - improved on Fasenra  - Lung testing looked good today.  - We are not going to make any changes at this time since you are doing so well.  - Daily controller medication(s): Breo 200/25 one puff once daily + Fasenra every 8 weeks - Prior to physical activity: ProAir 2 puffs 10-15 minutes before physical activity. - Rescue medications: ProAir 4 puffs every 4-6 hours as needed or albuterol nebulizer one vial puffs every 4-6 hours as needed - Asthma control goals:  * Full participation in all desired activities (may need albuterol before activity) * Albuterol use two time or less a week on average (not counting use with activity) * Cough interfering with sleep two time or less a month * Oral steroids no more than once a year * No hospitalizations  2. Seasonal and perennial allergic rhinitis  - Continue with Xhance 2 sprays per nostril in the morning and night.  - Add the spray at night will help decrease the mucous production at night.   3. Return in about 6 months (around 08/29/2019). This can be an in-person, a virtual Webex or a telephone follow up visit.   Please inform us of any Emergency Department visits, hospitalizations, or changes in symptoms. Call us before going to the ED for breathing or allergy symptoms since we might be able to fit you in for a sick visit. Feel free to contact us anytime with any questions, problems, or concerns.  It was a pleasure to see you again today!  Websites that have reliable patient information: 1. American Academy of Asthma, Allergy, and Immunology: www.aaaai.org 2. Food Allergy Research and Education (FARE): foodallergy.org 3. Mothers of Asthmatics: http://www.asthmacommunitynetwork.org 4. American College of Allergy, Asthma, and Immunology: www.acaai.org   COVID-19 Vaccine Information can be found at: ShippingScam.co.uk For questions related to vaccine  distribution or appointments, please email [email or web address] or call [phone].     "Like" [place] on Facebook and Instagram for our latest updates!        Make sure you are registered to vote! If you have moved or changed any of your contact information, you will need to get this updated before voting!  In some cases, you MAY be able to register to vote online: [email or web address]

## 2019-03-01 NOTE — Progress Notes (Signed)
Immunotherapy   Patient Details  Name: Maurice Richards MRN: [id number] Date of Birth: 06-02-1955  03/01/2019  Patient restarted Berna Bue today. Patient received 30mg  in his right arm today and will receive  30mg  every 4 weeks for 3 injections then 30mg  every 8 weeks. Patient waited 30 minutes prior leaving. No adverse finding noted.   Cathi Roan 03/01/2019, 11:37 AM

## 2019-03-02 ENCOUNTER — Ambulatory Visit: Payer: BLUE CROSS/BLUE SHIELD | Admitting: Allergy & Immunology

## 2019-04-02 ENCOUNTER — Ambulatory Visit: Payer: Self-pay

## 2019-04-11 ENCOUNTER — Ambulatory Visit (INDEPENDENT_AMBULATORY_CARE_PROVIDER_SITE_OTHER): Payer: 59

## 2019-04-11 ENCOUNTER — Other Ambulatory Visit: Payer: Self-pay

## 2019-04-11 DIAGNOSIS — J4550 Severe persistent asthma, uncomplicated: Secondary | ICD-10-CM | POA: Diagnosis not present

## 2019-04-16 ENCOUNTER — Ambulatory Visit: Payer: Self-pay

## 2019-05-09 ENCOUNTER — Ambulatory Visit: Payer: Self-pay

## 2019-05-15 ENCOUNTER — Ambulatory Visit: Payer: Self-pay

## 2019-05-15 ENCOUNTER — Ambulatory Visit (INDEPENDENT_AMBULATORY_CARE_PROVIDER_SITE_OTHER): Payer: 59

## 2019-05-15 ENCOUNTER — Other Ambulatory Visit: Payer: Self-pay

## 2019-05-15 DIAGNOSIS — J4550 Severe persistent asthma, uncomplicated: Secondary | ICD-10-CM | POA: Diagnosis not present

## 2019-05-23 ENCOUNTER — Encounter: Payer: Self-pay | Admitting: Internal Medicine

## 2019-06-01 ENCOUNTER — Ambulatory Visit: Payer: Self-pay

## 2019-06-11 ENCOUNTER — Ambulatory Visit: Payer: BC Managed Care – PPO | Admitting: Gastroenterology

## 2019-07-10 ENCOUNTER — Ambulatory Visit: Payer: Self-pay

## 2019-07-17 ENCOUNTER — Encounter: Payer: Self-pay | Admitting: Allergy & Immunology

## 2019-07-17 ENCOUNTER — Ambulatory Visit (INDEPENDENT_AMBULATORY_CARE_PROVIDER_SITE_OTHER): Payer: 59 | Admitting: Allergy & Immunology

## 2019-07-17 ENCOUNTER — Other Ambulatory Visit: Payer: Self-pay

## 2019-07-17 VITALS — BP 136/88 | HR 89 | Temp 98.1°F

## 2019-07-17 DIAGNOSIS — J4550 Severe persistent asthma, uncomplicated: Secondary | ICD-10-CM | POA: Diagnosis not present

## 2019-07-17 DIAGNOSIS — K9049 Malabsorption due to intolerance, not elsewhere classified: Secondary | ICD-10-CM | POA: Diagnosis not present

## 2019-07-17 DIAGNOSIS — J4551 Severe persistent asthma with (acute) exacerbation: Secondary | ICD-10-CM

## 2019-07-17 MED ORDER — TRELEGY ELLIPTA 200-62.5-25 MCG/INH IN AEPB: INHALATION_SPRAY | RESPIRATORY_TRACT | 5 refills | Status: DC

## 2019-07-17 MED ORDER — ALBUTEROL SULFATE HFA 108 (90 BASE) MCG/ACT IN AERS: 4.0000 | INHALATION_SPRAY | Freq: Four times a day (QID) | RESPIRATORY_TRACT | 1 refills | Status: DC | PRN

## 2019-07-17 NOTE — Progress Notes (Signed)
FOLLOW UP  Date of Service/Encounter:  07/17/19   Assessment:   Severe persistent asthmawith acute exacerbation - improved on Fasenra   Eosinophilia  Seasonal and perennial allergic rhinitis(trees, molds, dog, cat, dust mite)  Plan/Recommendations:   1. Severe persistent asthma - improved on Fasenra  - Lung testing looked good today.  - We are not going to make any changes at this time since you are doing so well.  - Daily controller medication(s): Breo 200/25 one puff once daily + Fasenra every 8 weeks - Prior to physical activity: ProAir 2 puffs 10-15 minutes before physical activity. - Rescue medications: ProAir 4 puffs every 4-6 hours as needed or albuterol nebulizer one vial puffs every 4-6 hours as needed - Asthma control goals:  * Full participation in all desired activities (may need albuterol before activity) * Albuterol use two time or less a week on average (not counting use with activity) * Cough interfering with sleep two time or less a month * Oral steroids no more than once a year * No hospitalizations  2. Seasonal and perennial allergic rhinitis  - Continue with Xhance 2 sprays per nostril in the morning and night.  - Add the spray at night will help decrease the mucous production at night.   3. Follow up in 6 months or earlier if needed. This can be an in-person, a virtual Webex or a telephone follow up visit.  Subjective:   Maurice Richards is a 64 y.o. male presenting today for follow up of  Chief Complaint  Patient presents with  . Asthma    coughing, wheezing, SOB  . Allergic Rhinitis     sore throat     MARCK MCCLENNY has a history of the following: Patient Active Problem List   Diagnosis Date Noted  . Severe persistent asthma, uncomplicated 99/83/3825  . Eosinophilia 01/27/2017  . Seasonal and perennial allergic rhinitis 10/15/2016  . Moderate persistent asthma, uncomplicated 05/39/7673  . Third degree hemorrhoids   . Reflux esophagitis    . Hiatal hernia   . GERD (gastroesophageal reflux disease) 12/31/2014  . Dysphagia 12/31/2014  . History of colonic polyps 12/31/2014    History obtained from: chart review and patient.  Maurice Richards is a 64 y.o. male presenting for a follow up visit. He was last seen in January 2021. AEt that time, his lnug testing looked amazing. We did not make any medication changes at that time and ocntinued him on Breo one puff once daily in conjunctio [place] every 8 weeks. Foir his rhinitis, we continued with Xhance two sprays per nostril BID, hoping that BID would decrease the mucous production at night.  Since the last visit, he has mostly done well.  He is quite talkative today as always.  He seems more so than normal.  Asthma/Respiratory Symptom History: He is on the Breo one puff once daily. He reports poor compliance with this. He does report that some foods cause phelgm in his throat. He has been wheezing and has discharge with certain foods, although he is unable to pinpoint the foods directly.  ACT score is 14, indicating poor asthma control.  He has been using his rescue inhaler more often than not, but he tells me that the "blue one" does not work as well as the "read one".  He also reports that his symptoms got a lot better when he is at the beach.  Needless to say, his symptoms not well controlled today and his spirometry reflects  that.  Allergic Rhinitis Symptom History: He remains on his XHANCE 2 sprays per nostril twice a day.  This seems to be working well to control his symptoms.  Aside from the worsening postnasal drip over the last 3 to 5 weeks, he has actually done fairly well.  He has not needed antibiotics at all since last visit.  Otherwise, there have been no changes to his past medical history, surgical history, family history, or social history.    Review of Systems  Constitutional: Negative.  Negative for chills, fever, malaise/fatigue and weight loss.  HENT: Positive for  congestion and sinus pain. Negative for ear discharge and ear pain.   Eyes: Negative for pain, discharge and redness.  Respiratory: Positive for cough, shortness of breath and wheezing. Negative for sputum production.   Cardiovascular: Negative.  Negative for chest pain and palpitations.  Gastrointestinal: Negative for abdominal pain, constipation, diarrhea, heartburn, nausea and vomiting.  Skin: Negative.  Negative for itching and rash.  Neurological: Negative for dizziness and headaches.  Endo/Heme/Allergies: Positive for environmental allergies. Does not bruise/bleed easily.       Objective:   Blood pressure 136/88, pulse 89, temperature 98.1 F (36.7 C), temperature source Temporal, SpO2 95 %. There is no height or weight on file to calculate BMI.   Physical Exam:  Physical Exam  Constitutional: He appears well-developed.  HENT:  Head: Normocephalic and atraumatic.  Right Ear: Tympanic membrane, external ear and ear canal normal.  Left Ear: Tympanic membrane, external ear and ear canal normal.  Nose: No mucosal edema, rhinorrhea, nasal deformity or septal deviation. Right sinus exhibits no maxillary sinus tenderness and no frontal sinus tenderness. Left sinus exhibits no maxillary sinus tenderness and no frontal sinus tenderness.  Mouth/Throat: Uvula is midline. Mucous membranes are not pale and not dry.  Eyes: Pupils are equal, round, and reactive to light. Conjunctivae are normal. Right eye exhibits no chemosis and no discharge. Left eye exhibits no chemosis and no discharge. Right conjunctiva is not injected. Left conjunctiva is not injected.  Cardiovascular: Normal rate, regular rhythm and normal heart sounds.  Respiratory: Effort normal. No accessory muscle usage. No tachypnea. No respiratory distress. He has wheezes. He has no rhonchi. He has no rales. He exhibits no tenderness.  Lymphadenopathy:    He has no cervical adenopathy.  Neurological: He is alert.  Skin: No  abrasion, no petechiae and no rash noted. Rash is not papular, not vesicular and not urticarial. No erythema. No pallor.     Diagnostic studies:    Spirometry: results abnormal (FEV1: 2.12/54%, FVC: 3.06/59%, FEV1/FVC: 69%).    Spirometry consistent with possible restrictive disease.  We did not give any bronchodilator in the office because his dog was in the car and we are worried about the safety of his dog.  Allergy Studies: none      Salvatore Marvel, MD  [facility]

## 2019-07-17 NOTE — Patient Instructions (Addendum)
1. Severe persistent asthma - improved on Fasenra  - Lung testing looked not great today. - Start the prednisone dose pack provided today. - We are going to change you to Trelegy one puff once daily (there is a copay card for this)  - Daily controller medication(s): Trelegy 200/25 one puff once daily + Fasenra every 8 weeks - Prior to physical activity: ProAir 2 puffs 10-15 minutes before physical activity. - Rescue medications: ProAir 4 puffs every 4-6 hours as needed or albuterol nebulizer one vial puffs every 4-6 hours as needed - Asthma control goals:  * Full participation in all desired activities (may need albuterol before activity) * Albuterol use two time or less a week on average (not counting use with activity) * Cough interfering with sleep two time or less a month * Oral steroids no more than once a year * No hospitalizations  2. Seasonal and perennial allergic rhinitis  - Continue with Xhance 2 sprays per nostril in the morning and night.  - Continue with Zyrtec (cetirizine) 10 mg daily. - Because you are concerned with the mucous production and foods, we are going to get a basic food panel.   3. Return in about 6 months (around 01/16/2020). This can be an in-person, a virtual Webex or a telephone follow up visit.   Please inform us of any Emergency Department visits, hospitalizations, or changes in symptoms. Call us before going to the ED for breathing or allergy symptoms since we might be able to fit you in for a sick visit. Feel free to contact us anytime with any questions, problems, or concerns.  It was a pleasure to see you again today!  Websites that have reliable patient information: 1. American Academy of Asthma, Allergy, and Immunology: www.aaaai.org 2. Food Allergy Research and Education (FARE): foodallergy.org 3. Mothers of Asthmatics: http://www.asthmacommunitynetwork.org 4. American College of Allergy, Asthma, and Immunology: www.acaai.org   COVID-19  Vaccine Information can be found at: ShippingScam.co.uk For questions related to vaccine distribution or appointments, please email [email or web address] or call [phone].     "Like" [place] on Facebook and Instagram for our latest updates!        Make sure you are registered to vote! If you have moved or changed any of your contact information, you will need to get this updated before voting!  In some cases, you MAY be able to register to vote online: [email or web address]

## 2019-07-18 ENCOUNTER — Encounter: Payer: Self-pay | Admitting: Allergy & Immunology

## 2019-07-18 NOTE — Progress Notes (Signed)
Called and spoke with patient. Maurice Richards states that his throat is still a little itchy. However, he feels much, much better. Patient said he slept much better and overall feels great. He wanted to say Thank you to Dr.Gallagher.

## 2019-07-21 LAB — ALLERGEN PROFILE, BASIC FOOD
Allergen Corn, IgE: <0.1 kU/L
Beef IgE: 3.01 kU/L — AB
Chocolate/Cacao IgE: <0.1 kU/L
Egg, Whole IgE: 0.63 kU/L — AB
Food Mix (Seafoods) IgE: NEGATIVE
Milk IgE: 0.55 kU/L — AB
Peanut IgE: <0.1 kU/L
Pork IgE: 1.26 kU/L — AB
Soybean IgE: <0.1 kU/L
Wheat IgE: <0.1 kU/L

## 2019-09-11 ENCOUNTER — Ambulatory Visit: Payer: Self-pay

## 2019-09-15 ENCOUNTER — Other Ambulatory Visit: Payer: Self-pay | Admitting: Allergy & Immunology

## 2019-10-01 ENCOUNTER — Other Ambulatory Visit: Payer: Self-pay

## 2019-10-01 ENCOUNTER — Ambulatory Visit (INDEPENDENT_AMBULATORY_CARE_PROVIDER_SITE_OTHER): Payer: 59

## 2019-10-01 DIAGNOSIS — J4551 Severe persistent asthma with (acute) exacerbation: Secondary | ICD-10-CM | POA: Diagnosis not present

## 2019-12-03 ENCOUNTER — Ambulatory Visit: Payer: Self-pay

## 2019-12-03 ENCOUNTER — Encounter: Payer: Self-pay | Admitting: Family

## 2019-12-03 ENCOUNTER — Ambulatory Visit (INDEPENDENT_AMBULATORY_CARE_PROVIDER_SITE_OTHER): Payer: 59 | Admitting: Family

## 2019-12-03 ENCOUNTER — Other Ambulatory Visit: Payer: Self-pay

## 2019-12-03 VITALS — BP 144/90 | HR 60 | Temp 97.9°F | Resp 19

## 2019-12-03 DIAGNOSIS — J4540 Moderate persistent asthma, uncomplicated: Secondary | ICD-10-CM

## 2019-12-03 DIAGNOSIS — J4550 Severe persistent asthma, uncomplicated: Secondary | ICD-10-CM

## 2019-12-03 DIAGNOSIS — K219 Gastro-esophageal reflux disease without esophagitis: Secondary | ICD-10-CM | POA: Diagnosis not present

## 2019-12-03 DIAGNOSIS — J4551 Severe persistent asthma with (acute) exacerbation: Secondary | ICD-10-CM | POA: Diagnosis not present

## 2019-12-03 DIAGNOSIS — K9049 Malabsorption due to intolerance, not elsewhere classified: Secondary | ICD-10-CM

## 2019-12-03 DIAGNOSIS — J302 Other seasonal allergic rhinitis: Secondary | ICD-10-CM

## 2019-12-03 DIAGNOSIS — D721 Eosinophilia, unspecified: Secondary | ICD-10-CM

## 2019-12-03 DIAGNOSIS — J3089 Other allergic rhinitis: Secondary | ICD-10-CM

## 2019-12-03 MED ORDER — PANTOPRAZOLE SODIUM 40 MG PO TBEC: 40.0000 mg | DELAYED_RELEASE_TABLET | Freq: Two times a day (BID) | ORAL | 0 refills | Status: DC

## 2019-12-03 MED ORDER — IPRATROPIUM BROMIDE 0.03 % NA SOLN
2.0000 | Freq: Two times a day (BID) | NASAL | 2 refills | Status: DC | PRN
Start: 2019-12-03 — End: 2020-01-04

## 2019-12-03 NOTE — Patient Instructions (Addendum)
Severe persistent asthma Continue Breo 200 mcg using 1 puff once a day to help prevent cough and wheeze Continue Fasenra injections every 8 weeks to help prevent cough and wheeze May use ProAir 2 puffs every 4 hours as needed for cough, wheeze, tightness in chest, shortness of breath.  Also may use albuterol 2 puffs 5 to 15 minutes prior to exercise Asthma control goals:   Full participation in all desired activities (may need albuterol before activity)  Albuterol use two time or less a week on average (not counting use with activity)  Cough interfering with sleep two time or less a month  Oral steroids no more than once a year  No hospitalizations  Seasonal and perennial allergic rhinitis (trees, molds, dog, cat, dust mite) Continue XHANCE using 2 sprays each nostril twice a day as needed for stuffy nose Start saline rinses to help with drainage. Start ipratropium bromide nasal spray using 2 sprays each nostril twice a day to help with drainage  Food intolerance If your symptoms re-occur, begin a journal of events that occurred for up to 6 hours before your symptoms began including foods and beverages consumed.  Continue to avoid red meats and pork.Limit dairy consumption. This can cause drainage.  You can try avoiding milk and egg to see if this helps with your symptoms  Reflux Increase Protonix to twice a day for one month  Please let us know if this treatment plan is not working well for you Schedule a follow-up appointment in  1 month

## 2019-12-03 NOTE — Progress Notes (Signed)
[facility] [place] [address] Dept: [phone]  FOLLOW UP NOTE  Patient ID: [name], male    DOB: [birth year]  Age: 64 y.o. MRN: [id number] Date of Office Visit: 12/03/2019  Assessment  Chief Complaint: Asthma  HPI Maurice Richards is a 64 year old male who presents today for follow-up of severe persistent asthma, seasonal and perennial allergic rhinitis and food intolerance.  He was last seen on July 17, 2019 by Dr. Ernst Bowler.  Severe persistent asthma is reported as moderately controlled with Breo 200 mcg 1 puff once a day and Fasenra injection every 8 weeks.  He reports occasional coughing, wheezing, shortness of breath and tightness in his chest.  His cough is described as productive at times with clear to white sputum. He denies any fever or chills.  Since his last office visit he has not required any systemic steroids or made any trips to the emergency room or urgent care due to breathing problems.  He reports that his breathing is better than what it was 3 to 5 months ago and feels that his Berna Bue is helping.  Seasonal and perennial allergic rhinitis is reported as not well controlled with XHANCE.  He reports clear rhinorrhea and postnasal drip.  He denies any nasal congestion or itchy watery eyes.  He continues to have problems with foods causing phlegm.  He reports that he can eat anything and he will begin to have phlegm.  Yesterday he had chicken from [facility] and began coughing due to phlegm.  He also mentions that he can drink beer and he will feel like his throat closes off.  He notes that dairy such as milk and ice cream have caused excess drainage, so he has switched to soy milk.  Since his last office visit he has not had any beef, but he did have pork 2 times.  One time when he had pork he had more congestion and the second time he did not have a problem with pork.  He denies any concomitant cardiorespiratory, gastrointestinal or cutaneous symptoms.   He  currently takes pantoprazole 40 mg once a day and reports reflux symptoms.  Current medications are as listed in the chart.     Drug Allergies:  No Known Allergies  Review of Systems: Review of Systems  Constitutional: Negative for chills and fever.  HENT:       Reports clear rhinorrhea and post nasal drip. Denies nasal congestion  Eyes:       Denies itchy watery eyes  Respiratory:       Reports occasional cough, wheeze, tightness in chest, and shortness of breath.  Cardiovascular: Negative for chest pain and palpitations.  Gastrointestinal:       Reports reflux and denies abdominal pain  Genitourinary: Negative for dysuria.  Skin: Negative for itching and rash.  Neurological: Negative for headaches.  Endo/Heme/Allergies: Positive for environmental allergies.    Physical Exam: BP (!) 144/90 (BP Location: Left Arm, Patient Position: Sitting, Cuff Size: Normal)   Pulse 60   Temp 97.9 F (36.6 C) (Temporal)   Resp 19   SpO2 94%    Physical Exam Constitutional:      Appearance: Normal appearance.  HENT:     Head: Normocephalic and atraumatic.     Right Ear: Tympanic membrane, ear canal and external ear normal.     Left Ear: Tympanic membrane, ear canal and external ear normal.     Nose: Nose normal.     Mouth/Throat:  Mouth: Mucous membranes are moist.     Pharynx: Oropharynx is clear.  Eyes:     Conjunctiva/sclera: Conjunctivae normal.  Cardiovascular:     Rate and Rhythm: Normal rate and regular rhythm.     Heart sounds: Normal heart sounds.  Pulmonary:     Effort: Pulmonary effort is normal.     Breath sounds: Normal breath sounds.     Comments: Lungs clear to auscultation Musculoskeletal:     Cervical back: Neck supple.  Skin:    General: Skin is warm.  Neurological:     Mental Status: He is alert and oriented to person, place, and time.  Psychiatric:        Mood and Affect: Mood normal.        Behavior: Behavior normal.        Thought Content:  Thought content normal.        Judgment: Judgment normal.     Diagnostics: FVC 3.82 L, FEV1 2.69 L.  Predicted FVC 5.19 L, FEV1 3.91 L.  Spirometry indicates mild restriction.  Spirometry is consistent with spirometry from March 01, 2019  Assessment and Plan: 1. Severe persistent asthma without complication   2. Seasonal and perennial allergic rhinitis   3. Food intolerance   4. Gastroesophageal reflux disease, unspecified whether esophagitis present   5. Eosinophilia, unspecified type     Meds ordered this encounter  Medications  . ipratropium (ATROVENT) 0.03 % nasal spray    Sig: Place 2 sprays into both nostrils 2 (two) times daily as needed for rhinitis.    Dispense:  30 mL    Refill:  2  . pantoprazole (PROTONIX) 40 MG tablet    Sig: Take 1 tablet (40 mg total) by mouth 2 (two) times daily.    Dispense:  60 tablet    Refill:  0    Patient Instructions  Severe persistent asthma Continue Breo 200 mcg using 1 puff once a day to help prevent cough and wheeze Continue Fasenra injections every 8 weeks to help prevent cough and wheeze May use ProAir 2 puffs every 4 hours as needed for cough, wheeze, tightness in chest, shortness of breath.  Also may use albuterol 2 puffs 5 to 15 minutes prior to exercise Asthma control goals:   Full participation in all desired activities (may need albuterol before activity)  Albuterol use two time or less a week on average (not counting use with activity)  Cough interfering with sleep two time or less a month  Oral steroids no more than once a year  No hospitalizations  Seasonal and perennial allergic rhinitis (trees, molds, dog, cat, dust mite) Continue XHANCE using 2 sprays each nostril twice a day as needed for stuffy nose Start saline rinses to help with drainage. Start ipratropium bromide nasal spray using 2 sprays each nostril twice a day to help with drainage  Food intolerance If your symptoms re-occur, begin a journal of  events that occurred for up to 6 hours before your symptoms began including foods and beverages consumed.  Continue to avoid red meats and pork.Limit dairy consumption. This can cause drainage.  You can try avoiding milk and egg to see if this helps with your symptoms  Reflux Increase Protonix to twice a day for one month  Please let us know if this treatment plan is not working well for you Schedule a follow-up appointment in  1 month   Return in about 4 weeks (around 12/31/2019), or if symptoms worsen or fail to  improve.    Thank you for the opportunity to care for this patient.  Please do not hesitate to contact me with questions.  Althea Charon, FNP [facility] [place] [facility] [place]

## 2019-12-04 ENCOUNTER — Ambulatory Visit: Payer: Self-pay

## 2019-12-04 ENCOUNTER — Ambulatory Visit: Payer: 59 | Admitting: Allergy & Immunology

## 2020-01-01 ENCOUNTER — Encounter: Payer: Self-pay | Admitting: Internal Medicine

## 2020-01-03 NOTE — Patient Instructions (Addendum)
Severe persistent asthma Continue Breo 200 mcg using 1 puff once a day to help prevent cough and wheeze Continue Fasenra injections every 8 weeks to help prevent cough and wheeze May use ProAir 2 puffs every 4 hours as needed for cough, wheeze, tightness in chest, shortness of breath.  Also may use albuterol 2 puffs 5 to 15 minutes prior to exercise Asthma control goals:   Full participation in all desired activities (may need albuterol before activity)  Albuterol use two time or less a week on average (not counting use with activity)  Cough interfering with sleep two time or less a month  Oral steroids no more than once a year  No hospitalizations  Seasonal and perennial allergic rhinitis (trees, molds, dog, cat, dust mite) Continue XHANCE using 2 sprays each nostril twice a day as needed for stuffy nose Start saline rinses to help with drainage. Continue ipratropium bromide nasal spray using 2 sprays each nostril twice a day to help with drainage  Food intolerance If your symptoms re-occur, begin a journal of events that occurred for up to 6 hours before your symptoms began including foods and beverages consumed.  Continue to avoid red meats, pork and foods that bother you.Limit dairy consumption. This can cause drainage.  You can try avoiding milk and egg to see if this helps with your symptoms Epinepherine auto injector refilled  Reflux Start Protonix to twice a day for one month Call your GI doctor to discuss your reflux symtpms  Please speak with your primary care physician concerning your high blood pressure today in our office.  Please let us know if this treatment plan is not working well for you Schedule a follow-up appointment in 3 months

## 2020-01-04 ENCOUNTER — Ambulatory Visit (INDEPENDENT_AMBULATORY_CARE_PROVIDER_SITE_OTHER): Payer: 59 | Admitting: Family

## 2020-01-04 ENCOUNTER — Encounter: Payer: Self-pay | Admitting: Family

## 2020-01-04 ENCOUNTER — Other Ambulatory Visit: Payer: Self-pay

## 2020-01-04 VITALS — BP 122/86 | HR 66 | Temp 98.1°F | Resp 14 | Ht 73.5 in | Wt 226.6 lb

## 2020-01-04 DIAGNOSIS — J3089 Other allergic rhinitis: Secondary | ICD-10-CM

## 2020-01-04 DIAGNOSIS — J302 Other seasonal allergic rhinitis: Secondary | ICD-10-CM

## 2020-01-04 DIAGNOSIS — J4550 Severe persistent asthma, uncomplicated: Secondary | ICD-10-CM

## 2020-01-04 DIAGNOSIS — K219 Gastro-esophageal reflux disease without esophagitis: Secondary | ICD-10-CM | POA: Diagnosis not present

## 2020-01-04 DIAGNOSIS — K9049 Malabsorption due to intolerance, not elsewhere classified: Secondary | ICD-10-CM

## 2020-01-04 MED ORDER — IPRATROPIUM BROMIDE 0.03 % NA SOLN: 2.0000 | Freq: Two times a day (BID) | NASAL | 2 refills | Status: DC | PRN

## 2020-01-04 MED ORDER — XHANCE 93 MCG/ACT NA EXHU: INHALANT_SUSPENSION | NASAL | 5 refills | Status: DC

## 2020-01-04 MED ORDER — ALBUTEROL SULFATE HFA 108 (90 BASE) MCG/ACT IN AERS: 2.0000 | INHALATION_SPRAY | RESPIRATORY_TRACT | 1 refills | Status: AC | PRN

## 2020-01-04 MED ORDER — BREO ELLIPTA 200-25 MCG/INH IN AEPB: 1.0000 | INHALATION_SPRAY | Freq: Every day | RESPIRATORY_TRACT | 5 refills | Status: DC

## 2020-01-04 MED ORDER — EPINEPHRINE 0.3 MG/0.3ML IJ SOAJ: 0.3000 mg | Freq: Once | INTRAMUSCULAR | 2 refills | Status: AC

## 2020-01-04 NOTE — Progress Notes (Signed)
[place] [address] Dept: [phone]  FOLLOW UP NOTE  Patient ID: Maurice Richards, male    DOB: [birth year]  Age: 64 y.o. MRN: [id number] Date of Office Visit: 01/04/2020  Assessment  Chief Complaint: Asthma (Laying flat causes him to wheeze and cough. Scratchy throat. )  HPI Maurice Richards is a 64 year old male who presents today for follow-up of severe persistent asthma, seasonal and perennial allergic rhinitis, food intolerance, gastroesophageal reflux disease, and eosinophilia.  He was last seen by myself on December 03, 2019.  Severe persistent asthma is reported as moderately controlled with Breo 200 mcg 1 puff once a day, Fasenra injections every 8 weeks, and albuterol as needed.  He reports occasional dry cough that at times is productive.  The color of his sputum is white and can be real thick during the day.  He also reports occasional wheezing, off and on tightness in his chest and occasional nocturnal awakenings.  Since his last office visit he has not required any steroids or made any trips to the emergency room or urgent care due to to breathing problems.  Since his last office visit he has used his albuterol approximately once.  He reports that he is scared to use the albuterol due to some of the side effects he was told it could cause.  Discussed when to use albuterol inhaler and its side effects.  He does feel like his Fasenra injections are helping his asthma.  Seasonal and perennial rhinitis is reported as moderately controlled with XHANCE nasal spray and ipratropium bromide nasal spray.  He reports occasional rhinorrhea and nasal congestion in the morning.  When he takes these medications his symptoms get better.  He also reports postnasal drip.  He does feel that the ipratropium bromide has helped there to be not as much drainage.  He is currently not using sinus rinses.  Reflux is reported as not well controlled.  He did not ever receive the prescription  for Protonix twice a day dosing.  He did start taking it twice a day for the past 4 to 5 days and feels that it has helped a little with his symptoms.  He last saw his GI doctor, Dr. Buford Dresser about 8 to 9 months ago.  Instructed on the importance of scheduling a follow-up with him to discuss reflux symptoms.  He continues to have issues with food intolerance.  He has noticed problems with tomatoes, oatmeal, spicy chicken, peanut butter and beer.  He has also had problems with [employer] sausage and egg biscuit, but reports that he ate bacon and eggs this morning without any issues.  He noticed that his mouth had a blister after eating peanut butter nabs.  He also ate peanut butter one other time felt like his throat was closing up.He took Benadryl and within 30 minutes he reported the sensation easing up. He has since been avoiding peanut butter. His IgE to peanuts on 07/17/2019 was < 0.10 kU/L.    He does not know where his epinephrine autoinjector device is located.   Drug Allergies:  Allergies  Allergen Reactions  . Alpha-Gal     Review of Systems: Review of Systems  Constitutional: Negative for chills and fever.  HENT:       Reports occasional rhinrrhea and nasal congestion in the morning and when does medications his symptoms are fine. Also reports post nasal drip  Eyes:       Denies itchy watery eyes  Respiratory: Positive  for cough, shortness of breath and wheezing.   Cardiovascular: Negative for chest pain and palpitations.  Gastrointestinal:       Reports reflux symptoms  Genitourinary: Negative for dysuria.  Skin: Positive for itching. Negative for rash.       Reports occasional itching  Neurological: Positive for headaches.       Reports occasional headaches  Endo/Heme/Allergies: Positive for environmental allergies.    Physical Exam: BP 122/86   Pulse 66   Temp 98.1 F (36.7 C)   Resp 14   Ht 6' 1.5" (1.867 m)   Wt 226 lb 9.6 oz (102.8 kg)   SpO2 95%   BMI 29.49 kg/m      Physical Exam Constitutional:      Appearance: Normal appearance.  HENT:     Head: Normocephalic and atraumatic.     Comments: Pharynx normal. Eyes normal. Ears normal. Nose normal    Right Ear: Tympanic membrane, ear canal and external ear normal.     Left Ear: Tympanic membrane, ear canal and external ear normal.     Nose: Nose normal.     Mouth/Throat:     Mouth: Mucous membranes are moist.     Pharynx: Oropharynx is clear.  Eyes:     Conjunctiva/sclera: Conjunctivae normal.  Cardiovascular:     Rate and Rhythm: Regular rhythm.     Heart sounds: Normal heart sounds.  Pulmonary:     Effort: Pulmonary effort is normal.     Breath sounds: Normal breath sounds.     Comments: Lungs clear to auscultation Musculoskeletal:     Cervical back: Neck supple.  Skin:    General: Skin is warm.  Neurological:     Mental Status: He is alert and oriented to person, place, and time.  Psychiatric:        Mood and Affect: Mood normal.        Behavior: Behavior normal.        Thought Content: Thought content normal.        Judgment: Judgment normal.     Diagnostics: FVC 3.68 L, FEV1 2.63 L.  Predicted FVC 5.19 L, FEV1 3.91 L.  Spirometry indicates mild restriction.  Spirometry is consistent with previous spirometry's.  Assessment and Plan: 1. Severe persistent asthma without complication   2. Seasonal and perennial allergic rhinitis   3. Food intolerance   4. Gastroesophageal reflux disease, unspecified whether esophagitis present     No orders of the defined types were placed in this encounter.   Patient Instructions  Severe persistent asthma Continue Breo 200 mcg using 1 puff once a day to help prevent cough and wheeze Continue Fasenra injections every 8 weeks to help prevent cough and wheeze May use ProAir 2 puffs every 4 hours as needed for cough, wheeze, tightness in chest, shortness of breath.  Also may use albuterol 2 puffs 5 to 15 minutes prior to exercise Asthma control  goals:   Full participation in all desired activities (may need albuterol before activity)  Albuterol use two time or less a week on average (not counting use with activity)  Cough interfering with sleep two time or less a month  Oral steroids no more than once a year  No hospitalizations  Seasonal and perennial allergic rhinitis (trees, molds, dog, cat, dust mite) Continue XHANCE using 2 sprays each nostril twice a day as needed for stuffy nose Start saline rinses to help with drainage. Continue ipratropium bromide nasal spray using 2 sprays each nostril twice  a day to help with drainage  Food intolerance If your symptoms re-occur, begin a journal of events that occurred for up to 6 hours before your symptoms began including foods and beverages consumed.  Continue to avoid red meats, pork and foods that bother you.Limit dairy consumption. This can cause drainage.  You can try avoiding milk and egg to see if this helps with your symptoms Epinepherine auto injector refilled  Reflux Start Protonix to twice a day for one month Call your GI doctor to discuss your reflux symtpms  Please speak with your primary care physician concerning your high blood pressure today in our office.  Please let us know if this treatment plan is not working well for you Schedule a follow-up appointment in 3 months   Return in about 3 months (around 04/03/2020), or if symptoms worsen or fail to improve.    Thank you for the opportunity to care for this patient.  Please do not hesitate to contact me with questions.  Althea Charon, FNP [facility] [place] [facility] [place]

## 2020-01-10 ENCOUNTER — Encounter: Payer: Self-pay | Admitting: Nurse Practitioner

## 2020-01-10 ENCOUNTER — Ambulatory Visit (INDEPENDENT_AMBULATORY_CARE_PROVIDER_SITE_OTHER): Payer: 59 | Admitting: Nurse Practitioner

## 2020-01-10 ENCOUNTER — Other Ambulatory Visit: Payer: Self-pay

## 2020-01-10 VITALS — BP 138/91 | HR 58 | Temp 98.2°F | Ht 73.0 in | Wt 223.6 lb

## 2020-01-10 DIAGNOSIS — K219 Gastro-esophageal reflux disease without esophagitis: Secondary | ICD-10-CM

## 2020-01-10 DIAGNOSIS — K642 Third degree hemorrhoids: Secondary | ICD-10-CM

## 2020-01-10 DIAGNOSIS — Z86010 Personal history of colonic polyps: Secondary | ICD-10-CM

## 2020-01-10 NOTE — Patient Instructions (Signed)
Your health issues we discussed today were:   GERD (reflux/heartburn): 1. I am glad you are feeling better on the twice daily dosing of Protonix! 2. Continue taking your Protonix twice a day as you have been.  Take this first thing in the morning and 30 minutes for your last meal the day 3. Call us for any worsening or severe symptoms  Hemorrhoids: 1. As we discussed, there is no indication for hemorrhoid banding if you are not having symptoms 2. Call us if your hemorrhoids begin to worsen or if you start having symptoms such as rectal pain, irritation, burning or if you start seeing any further bleeding.  Overall I recommend:  1. Continue your other current medications 2. Return for follow-up 1 year 3. Follow-up with your primary care for any concerns about tick bites in the past year 4. Call if you have any questions or concerns   ---------------------------------------------------------------  I am glad you have gotten your COVID-19 vaccination!  Even though you are fully vaccinated you should continue to follow CDC and state/local guidelines.  ---------------------------------------------------------------   At [facility] we value your feedback. You may receive a survey about your visit today. Please share your experience as we strive to create trusting relationships with our patients to provide genuine, compassionate, quality care.  We appreciate your understanding and patience as we review any laboratory studies, imaging, and other diagnostic tests that are ordered as we care for you. Our office policy is 5 business days for review of these results, and any emergent or urgent results are addressed in a timely manner for your best interest. If you do not hear from our office in 1 week, please contact us.   We also encourage the use of MyChart, which contains your medical information for your review as well. If you are not enrolled in this feature, an access code is  on this after visit summary for your convenience. Thank you for allowing [place] to be involved in your care.  It was great to see you today!  I hope you have a Merry Christmas and Happy Holidays!!

## 2020-01-10 NOTE — Progress Notes (Signed)
Referring Provider: Redmond School, MD Primary Care Physician:  Redmond School, MD Primary GI:  Dr. Gala Romney  Chief Complaint  Patient presents with  . Gastroesophageal Reflux    Cough x 7 months. Seeing allergists now. Stopped eating red meat. Feels acid may be coming back up in throat. Allergist advised to take pantoprazole BID and he started this 7 days ago and is feeling some better  . Hemorrhoids    Wants to discuss possible banding    HPI:   Maurice Richards is a 64 y.o. male who presents for follow-up.  The patient was last seen in our office 11/30/2017 for GERD and third-degree hemorrhoids.  At that time he had completed hemorrhoid banding 2018 with a good response, rarely any issues.  Rare bright red blood per rectum if he sits for prolonged period of time or strains.  Regular bowel movements for the past year.  Reflux well managed on pantoprazole with no other overt GI symptoms.  Recommend continue pantoprazole once daily, follow-up in 1 year or sooner if needed.  Today states doing okay overall. He states he's been having a worsening cough over the past 7-8 months and thinks worsening GERD may be contributing. He's seen an allergist who recommended he increase Protonix to bid about a week ago and since then he's been feeling significantly better. Was told by allergist he likely has alpha-gal. He has hemorrhoids, they're not really bothering him and no bleeding, but "can feel them there." Is asking about having hemorrhoid banding again, but is agreeable to waiting until he's having problems. He doesn't like taking medication. Denies abdominal pain (unless he eats really greasy foods), N/V, hematochezia, melena, fever, chills, unintentional weight loss. No dysphagia. Denies URI or flu-like symptoms. Denies loss of sense of taste or smell. The patient has received COVID-19 vaccination(s). Denies chest pain, dyspnea, dizziness, lightheadedness, syncope, near syncope. Denies any other upper or  lower GI symptoms.  Colonoscopy up to date. States he was previously Hepatitis C positive and states he underwent treatment in 1996.  Past Medical History:  Diagnosis Date  . Anxiety   . Cervical disc herniation   . Headache(784.0)    from cervical disc  . Hemorrhoids   . Hepatitis    hep C  . Plantar fasciitis   . Tubular adenoma     Past Surgical History:  Procedure Laterality Date  . COLONOSCOPY  02/24/2007   Dr.Rourk- friable anal canal hemorrhoids o/w normal rectum, diminutive mid descending colon polyps, a couple of submucosal petechiae on a couple folds in the L colon remainder of colonic mucosa appeared normal bx=tubular adenoma  . COLONOSCOPY WITH PROPOFOL N/A 01/06/2015   Dr.Rourk- grade 3 hemorrhoids, normal colonoscopy  . ESOPHAGOGASTRODUODENOSCOPY (EGD) WITH PROPOFOL N/A 01/06/2015   Dr.Rourk- erosive reflux esophagitis, small hiatal hernia  . HEMORRHOID BANDING    . INSERTION OF MESH N/A 10/31/2013   Procedure: INSERTION OF MESH;  Surgeon: Jamesetta So, MD;  Location: [facility];  Service: General;  Laterality: N/A;  Umbilical  . KNEE ARTHROSCOPY Bilateral   . MALONEY DILATION N/A 01/06/2015   Procedure: Venia Minks DILATION;  Surgeon: Daneil Dolin, MD;  Location: [facility];  Service: Endoscopy;  Laterality: N/A;  . ROTATOR CUFF REPAIR Bilateral   . TONSILLECTOMY    . UMBILICAL HERNIA REPAIR N/A 10/31/2013   Procedure: UMBILICAL HERNIORRHAPHY WITH MESH;  Surgeon: Jamesetta So, MD;  Location: [facility];  Service: General;  Laterality: N/A;    Current  Outpatient Medications  Medication Sig Dispense Refill  . acetaminophen (TYLENOL) 500 MG tablet Take 500 mg by mouth every 6 (six) hours as needed. Reported on 05/30/2015    . albuterol (VENTOLIN HFA) 108 (90 Base) MCG/ACT inhaler Inhale 2 puffs into the lungs every 4 (four) hours as needed for wheezing or shortness of breath. 18 g 1  . fluticasone furoate-vilanterol (BREO ELLIPTA) 100-25 MCG/INH AEPB Breo Ellipta 100 mcg-25  mcg/dose powder for inhalation  Inhale 1 puff every day by inhalation route.    . Fluticasone Propionate (XHANCE) 93 MCG/ACT EXHU SPRAY 2 SPRAY PER NOSTRIL ONCE DAILY IN THE MORNING. 16 mL 5  . Fluticasone-Umeclidin-Vilant (TRELEGY ELLIPTA) 200-62.5-25 MCG/INH AEPB 1 puff once daily 60 each 5  . ipratropium (ATROVENT) 0.03 % nasal spray Place 2 sprays into both nostrils 2 (two) times daily as needed for rhinitis. 30 mL 2  . lisinopril (ZESTRIL) 2.5 MG tablet Take 2.5 mg by mouth daily.    Marland Kitchen oxyCODONE-acetaminophen (PERCOCET) 10-325 MG tablet TK 1 T PO BID PRN  0  . pantoprazole (PROTONIX) 40 MG tablet Take 1 tablet (40 mg total) by mouth 2 (two) times daily. 60 tablet 0   Current Facility-Administered Medications  Medication Dose Route Frequency Provider Last Rate Last Admin  . Benralizumab SOSY 30 mg  30 mg Subcutaneous Q28 days Valentina Shaggy, MD   30 mg at 12/03/19 1323    Allergies as of 01/10/2020 - Review Complete 01/10/2020  Allergen Reaction Noted  . Alpha-gal  01/04/2020    Family History  Problem Relation Age of Onset  . Prostate cancer Father   . Colon cancer Neg Hx     Social History   Socioeconomic History  . Marital status: Divorced    Spouse name: Not on file  . Number of children: Not on file  . Years of education: Not on file  . Highest education level: Not on file  Occupational History  . Not on file  Tobacco Use  . Smoking status: Former Smoker    Packs/day: 1.50    Years: 10.00    Pack years: 15.00    Types: Cigarettes    Quit date: 01/01/1990    Years since quitting: 30.0  . Smokeless tobacco: Former Systems developer    Quit date: 10/22/1996  Vaping Use  . Vaping Use: Never used  Substance and Sexual Activity  . Alcohol use: Yes    Comment: seldom  . Drug use: No  . Sexual activity: Yes    Birth control/protection: None  Other Topics Concern  . Not on file  Social History Narrative  . Not on file   Social Determinants of Health   Financial  Resource Strain: Not on file  Food Insecurity: Not on file  Transportation Needs: Not on file  Physical Activity: Not on file  Stress: Not on file  Social Connections: Not on file    Subjective: Review of Systems  Constitutional: Negative for chills, fever, malaise/fatigue and weight loss.  HENT: Negative for congestion and sore throat.   Respiratory: Negative for cough and shortness of breath.   Cardiovascular: Negative for chest pain and palpitations.  Gastrointestinal: Positive for heartburn. Negative for abdominal pain, blood in stool, diarrhea, melena, nausea and vomiting.       Hemorrhoids  Musculoskeletal: Negative for joint pain and myalgias.  Skin: Negative for rash.  Neurological: Negative for dizziness and weakness.  Endo/Heme/Allergies: Does not bruise/bleed easily.  Psychiatric/Behavioral: Negative for depression. The patient is not nervous/anxious.  All other systems reviewed and are negative.    Objective: BP (!) 138/91   Pulse (!) 58   Temp 98.2 F (36.8 C)   Ht 6\' 1"  (1.854 m)   Wt 223 lb 9.6 oz (101.4 kg)   BMI 29.50 kg/m  Physical Exam Vitals and nursing note reviewed.  Constitutional:      General: He is not in acute distress.    Appearance: Normal appearance. He is normal weight. He is not ill-appearing, toxic-appearing or diaphoretic.  HENT:     Head: Normocephalic and atraumatic.     Nose: No congestion or rhinorrhea.  Eyes:     General: No scleral icterus. Cardiovascular:     Rate and Rhythm: Normal rate and regular rhythm.     Heart sounds: Normal heart sounds.  Pulmonary:     Effort: Pulmonary effort is normal.     Breath sounds: Normal breath sounds.  Abdominal:     General: Bowel sounds are normal. There is no distension.     Palpations: Abdomen is soft. There is no hepatomegaly, splenomegaly or mass.     Tenderness: There is no abdominal tenderness. There is no guarding or rebound.     Hernia: No hernia is present.  Musculoskeletal:      Cervical back: Neck supple.  Skin:    General: Skin is warm and dry.     Coloration: Skin is not jaundiced.     Findings: No bruising or rash.  Neurological:     General: No focal deficit present.     Mental Status: He is alert and oriented to person, place, and time. Mental status is at baseline.  Psychiatric:        Mood and Affect: Mood normal.        Behavior: Behavior normal.        Thought Content: Thought content normal.      Assessment:  Very pleasant 64 year old male presents for follow-up on GERD and hemorrhoids.  He has a history of colon polyps and colonoscopy is up-to-date.  History of hepatitis C (per the patient) which he states was treated and eradicated.  Hepatitis C care gap updated.  He was having some worsening GERD symptoms but this is improved with allergist titrating his medication.  GERD: He describes some worsening cough and reflux type symptoms over the past several months.  He has seen an allergist recommend increasing PPI to twice daily about a month ago, which he implemented 1 week ago.  States he is doing a lot better.  Recommended trigger avoidance.  Hemorrhoids: Initially asked about hemorrhoid banding (last completed 2018).  When I tried to identify further his symptoms he stated he is not really having symptoms with them, no bleeding, rectal itching, irritation.  However "I know they are there".  We discussed it would likely be better to hold off until he is actually symptomatic and then we could try topical therapies, which she states he usually does not use topical therapies.  Recommended he follow-up with [place] if any worsening of his hemorrhoid symptoms we can consider banding at that time.   Plan: 1. Continue current medications, specifically Protonix twice daily 2. Call for any worsening hemorrhoid symptoms 3. Follow-up 1 year    Thank you for allowing [place] to participate in the care of Toma Aran, DNP, AGNP-C Adult &  Gerontological Nurse Practitioner [facility]   01/10/2020 10:27 AM   Disclaimer: This note was dictated with voice recognition  software. Similar sounding words can inadvertently be transcribed and may not be corrected upon review.

## 2020-01-11 NOTE — Progress Notes (Signed)
Cc'ed to pcp °

## 2020-01-31 ENCOUNTER — Telehealth: Payer: Self-pay | Admitting: Family

## 2020-01-31 NOTE — Telephone Encounter (Signed)
Please advise 

## 2020-01-31 NOTE — Telephone Encounter (Signed)
Patient states that his insurance needs all future medications sent through [facility] instead of [facility]. [facility] phone number: [phone] and fax: [phone].  Patient has one Fasenra injection at the office for his appointment on 1/3.

## 2020-02-04 ENCOUNTER — Other Ambulatory Visit: Payer: Self-pay

## 2020-02-04 ENCOUNTER — Ambulatory Visit (INDEPENDENT_AMBULATORY_CARE_PROVIDER_SITE_OTHER): Payer: 59 | Admitting: *Deleted

## 2020-02-04 DIAGNOSIS — J4550 Severe persistent asthma, uncomplicated: Secondary | ICD-10-CM | POA: Diagnosis not present

## 2020-02-04 NOTE — Telephone Encounter (Signed)
Called patient and advised aware of same and will send same

## 2020-03-01 ENCOUNTER — Other Ambulatory Visit: Payer: Self-pay | Admitting: Family

## 2020-03-03 NOTE — Telephone Encounter (Signed)
Left message to return call seeing how patient did on trial meds of protonix to continue therapy!

## 2020-03-03 NOTE — Telephone Encounter (Signed)
Please call the patient and see if the twice a day dosing of his Protonix has made a difference in his symptoms.   Thank you

## 2020-03-05 NOTE — Telephone Encounter (Signed)
Attempted to call patient but patient was busy and wanted [place] to call him back at a later time.

## 2020-03-06 NOTE — Telephone Encounter (Signed)
Do not refill until we find out if twice a day dosing made a difference in his symptoms. Thank you!

## 2020-03-06 NOTE — Telephone Encounter (Signed)
Please advise to refill as you stated in avs to only do for one month

## 2020-03-07 ENCOUNTER — Other Ambulatory Visit: Payer: Self-pay | Admitting: Gastroenterology

## 2020-03-07 ENCOUNTER — Telehealth: Payer: Self-pay | Admitting: *Deleted

## 2020-03-07 DIAGNOSIS — K219 Gastro-esophageal reflux disease without esophagitis: Secondary | ICD-10-CM

## 2020-03-07 MED ORDER — PANTOPRAZOLE SODIUM 40 MG PO TBEC: 40.0000 mg | DELAYED_RELEASE_TABLET | Freq: Two times a day (BID) | ORAL | 5 refills | Status: DC

## 2020-03-07 NOTE — Telephone Encounter (Signed)
Rx sent 

## 2020-03-07 NOTE — Telephone Encounter (Signed)
Patient needs new Rx for pantoprazole BIS to be sent to [facility] in [place].

## 2020-03-10 NOTE — Telephone Encounter (Signed)
Called and left a message for patient to call our office back to see if the medication BID was working for him.

## 2020-03-19 ENCOUNTER — Other Ambulatory Visit: Payer: Self-pay | Admitting: *Deleted

## 2020-03-19 MED ORDER — FASENRA 30 MG/ML ~~LOC~~ SOSY: 30.0000 mg | PREFILLED_SYRINGE | SUBCUTANEOUS | 7 refills | Status: DC

## 2020-03-31 ENCOUNTER — Ambulatory Visit: Payer: Self-pay

## 2020-04-08 ENCOUNTER — Ambulatory Visit (INDEPENDENT_AMBULATORY_CARE_PROVIDER_SITE_OTHER): Payer: 59 | Admitting: Allergy & Immunology

## 2020-04-08 ENCOUNTER — Other Ambulatory Visit: Payer: Self-pay

## 2020-04-08 ENCOUNTER — Encounter: Payer: Self-pay | Admitting: Allergy & Immunology

## 2020-04-08 VITALS — BP 134/90 | HR 64 | Temp 97.9°F | Resp 18 | Ht 73.0 in | Wt 222.8 lb

## 2020-04-08 DIAGNOSIS — J302 Other seasonal allergic rhinitis: Secondary | ICD-10-CM

## 2020-04-08 DIAGNOSIS — K9049 Malabsorption due to intolerance, not elsewhere classified: Secondary | ICD-10-CM | POA: Diagnosis not present

## 2020-04-08 DIAGNOSIS — J3089 Other allergic rhinitis: Secondary | ICD-10-CM | POA: Diagnosis not present

## 2020-04-08 DIAGNOSIS — J4550 Severe persistent asthma, uncomplicated: Secondary | ICD-10-CM | POA: Diagnosis not present

## 2020-04-08 MED ORDER — ALBUTEROL SULFATE HFA 108 (90 BASE) MCG/ACT IN AERS: 2.0000 | INHALATION_SPRAY | RESPIRATORY_TRACT | 3 refills | Status: DC | PRN

## 2020-04-08 MED ORDER — FLUTICASONE-SALMETEROL 250-50 MCG/DOSE IN AEPB: 1.0000 | INHALATION_SPRAY | Freq: Two times a day (BID) | RESPIRATORY_TRACT | 5 refills | Status: DC

## 2020-04-08 NOTE — Progress Notes (Unsigned)
FOLLOW UP  Date of Service/Encounter:  04/08/20   Assessment:   Severe persistent asthma without complication  Worsening spirometric findings  Seasonal and perennial allergic rhinitis   Food intolerance - getting labs today  Plan/Recommendations:   1. Severe persistent asthma - improved on Fasenra  - Lung testing looked worse today. - We need to find something cheaper for you so that you take it more regularly.  - Start Advair 250/50 one puff twice daily.   - Daily controller medication(s): Advair 250/50 one puff twice daily + Fasenra every 8 weeks - Prior to physical activity: ProAir 2 puffs 10-15 minutes before physical activity. - Rescue medications: ProAir 4 puffs every 4-6 hours as needed or albuterol nebulizer one vial puffs every 4-6 hours as needed - Asthma control goals:  * Full participation in all desired activities (may need albuterol before activity) * Albuterol use two time or less a week on average (not counting use with activity) * Cough interfering with sleep two time or less a month * Oral steroids no more than once a year * No hospitalizations  2. Seasonal and perennial allergic rhinitis  - Continue with Xhance 2 sprays per nostril in the morning and night.  - Continue with ipratropium once spray per nostril up to four times daily for RUNNY NOSE.   3. Food allergies - We are going to get an alpha gal panel and a milk panel and an egg panel. - We will also give you a list of the foods that we offer from an allergy testing perspective. - We can do food allergy testing at the next visit.   4. Return in about 4 months (around 08/08/2020).   Subjective:   Maurice Richards is a 65 y.o. male presenting today for follow up of  Chief Complaint  Patient presents with  . Asthma    Not using the trellegy - was told by his case worker to stop using it. Truett Perna helps would like another sample     Maurice Richards has a history of the following: Patient Active  Problem List   Diagnosis Date Noted  . Severe persistent asthma, uncomplicated 16/11/9602  . Eosinophilia 01/27/2017  . Seasonal and perennial allergic rhinitis 10/15/2016  . Moderate persistent asthma, uncomplicated 54/10/8117  . Third degree hemorrhoids   . Reflux esophagitis   . Hiatal hernia   . GERD (gastroesophageal reflux disease) 12/31/2014  . Dysphagia 12/31/2014  . History of colonic polyps 12/31/2014    History obtained from: chart review and patient who is quite the talker.   Maurice Richards is a 65 y.o. male presenting for a follow up visit.  He is well-known to this practice and has a history of severe persistent asthma.  He was last seen in December 2021.  At that time, he was continued on Breo 1 puff once daily as well as Fasenra every 8 weeks.  For his allergic rhinitis, he was continued on XHANCE 2 sprays per nostril twice daily as well as saline rinses.  For his food intolerance history, he was encouraged to keep a journal of events and food triggers.  He was encouraged to avoid red meats.  He was started on Protonix twice a day to see if this helps with GI symptoms.  Since the last visit, she has done well. He is quite talkative today, as always.  Asthma/Respiratory Symptom History: He was on Breo which he does take at night. He does not take it every night. He  is up-to-date on his Berna Bue.  The combination seems to be working well for him.  He does report some increased shortness of breath over the last week or 2.  Although he endorses compliance with his Memory Dance, he also says in the same sentence that he does not take it every night.  It is very unclear why he does not take his medications on a routine basis.  I am not sure whether it is the cost or what.  Allergic Rhinitis Symptom History: He is liking the [place]. He has quite a supply of this.  He is not using any antihistamine.  He always feels better when he is on the beach.  Food Allergy Symptom History: He has been bit by a  tick and he is concerned getting issues when he eats ice cream and milk. Currently he is avoiding red meats. He does occasionally eat some pork. He is trying to stay off egg, but then he tells me that he eats egg whites (he is eating egg whites recently). He reports that he has blistering with egg exposure.  He did have a basic food panel performed in June 2021.  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  We recommended that he avoid all mammalian meats.  He tells me that he avoids all red meats, but occasionally will have a pork chop.  I do point out that this is actually red meat and he needs to avoid it based on his testing.  He is just not very compliant with his diet.  He is avoiding eggs, although he eats baked eggs.  His avoidance of milk is Engineer, petroleum.  He is on Protonix BID. This is helping but now he is back down to taking one of them. He thinks that getting off of the red meat has made things worse. He thinks in retrospect the tick bite might have made things worse initially.   Otherwise, there have been no changes to his past medical history, surgical history, family history, or social history.    Review of Systems  Constitutional: Negative.  Negative for chills, fever, malaise/fatigue and weight loss.  HENT: Positive for congestion and sinus pain. Negative for ear discharge and ear pain.   Eyes: Negative for pain, discharge and redness.  Respiratory: Negative for cough, sputum production, shortness of breath and wheezing.   Cardiovascular: Negative.  Negative for chest pain and palpitations.  Gastrointestinal: Positive for abdominal pain, diarrhea and nausea. Negative for constipation, heartburn and vomiting.  Skin: Negative.  Negative for itching and rash.  Neurological: Negative for dizziness and headaches.  Endo/Heme/Allergies: Positive for environmental allergies. Does not bruise/bleed easily.       Objective:   Blood  pressure 134/90, pulse 64, temperature 97.9 F (36.6 C), resp. rate 18, height 6\' 1"  (1.854 m), weight 222 lb 12.8 oz (101.1 kg), SpO2 97 %. Body mass index is 29.39 kg/m.   Physical Exam:  Physical Exam Constitutional:      Appearance: He is well-developed.     Comments: Extremely talkative.  HENT:     Head: Normocephalic and atraumatic.     Right Ear: Tympanic membrane, ear canal and external ear normal.     Left Ear: Tympanic membrane, ear canal and external ear normal.     Nose: No nasal deformity, septal deviation, mucosal edema or rhinorrhea.     Right Turbinates: Enlarged.     Left Turbinates: Enlarged and  swollen.     Right Sinus: No maxillary sinus tenderness or frontal sinus tenderness.     Left Sinus: No maxillary sinus tenderness or frontal sinus tenderness.     Mouth/Throat:     Mouth: Mucous membranes are not pale and not dry.     Pharynx: Uvula midline.  Eyes:     General:        Right eye: No discharge.        Left eye: No discharge.     Conjunctiva/sclera: Conjunctivae normal.     Right eye: Right conjunctiva is not injected. No chemosis.    Left eye: Left conjunctiva is not injected. No chemosis.    Pupils: Pupils are equal, round, and reactive to light.  Cardiovascular:     Rate and Rhythm: Normal rate and regular rhythm.     Heart sounds: Normal heart sounds.  Pulmonary:     Effort: Pulmonary effort is normal. No tachypnea, accessory muscle usage or respiratory distress.     Breath sounds: Normal breath sounds. No wheezing, rhonchi or rales.     Comments: Moving air well in all lung fields.  No increased work of breathing. Chest:     Chest wall: No tenderness.  Lymphadenopathy:     Cervical: No cervical adenopathy.  Skin:    General: Skin is warm.     Capillary Refill: Capillary refill takes less than 2 seconds.     Coloration: Skin is not pale.     Findings: No abrasion, erythema, petechiae or rash. Rash is not papular, urticarial or vesicular.      Comments: No eczematous or urticarial lesions noted.  Neurological:     Mental Status: He is alert.      Diagnostic studies:    Spirometry: results abnormal (FEV1: 1.59/44%, FVC: 1.82/38%, FEV1/FVC: 87%).    Spirometry consistent with possible restrictive disease. Albuterol four puffs via MDI treatment given in clinic with significant improvement in FEV1 and FVC per ATS criteria.  Allergy Studies: Labs sent instead       Salvatore Marvel, MD  [facility]

## 2020-04-08 NOTE — Patient Instructions (Addendum)
1. Severe persistent asthma - improved on Fasenra  - Lung testing looked worse today. - We need to find something cheaper for you so that you take it more regularly.  - Start Advair 250/50 one puff twice daily.   - Daily controller medication(s): Advair 250/50 one puff twice daily + Fasenra every 8 weeks - Prior to physical activity: ProAir 2 puffs 10-15 minutes before physical activity. - Rescue medications: ProAir 4 puffs every 4-6 hours as needed or albuterol nebulizer one vial puffs every 4-6 hours as needed - Asthma control goals:  * Full participation in all desired activities (may need albuterol before activity) * Albuterol use two time or less a week on average (not counting use with activity) * Cough interfering with sleep two time or less a month * Oral steroids no more than once a year * No hospitalizations  2. Seasonal and perennial allergic rhinitis  - Continue with Xhance 2 sprays per nostril in the morning and night.  - Continue with ipratropium once spray per nostril up to four times daily for RUNNY NOSE.   3. Food allergies - We are going to get an alpha gal panel and a milk panel and an egg panel. - We will also give you a list of the foods that we offer from an allergy testing perspective. - We can do food allergy testing at the next visit.   4. Return in about 4 months (around 08/08/2020).    Please inform us of any Emergency Department visits, hospitalizations, or changes in symptoms. Call us before going to the ED for breathing or allergy symptoms since we might be able to fit you in for a sick visit. Feel free to contact us anytime with any questions, problems, or concerns.  It was a pleasure to see you again today!  Websites that have reliable patient information: 1. American Academy of Asthma, Allergy, and Immunology: www.aaaai.org 2. Food Allergy Research and Education (FARE): foodallergy.org 3. Mothers of Asthmatics: http://www.asthmacommunitynetwork.org 4.  American College of Allergy, Asthma, and Immunology: www.acaai.org   COVID-19 Vaccine Information can be found at: ShippingScam.co.uk For questions related to vaccine distribution or appointments, please email [email or web address] or call [phone].   We realize that you might be concerned about having an allergic reaction to the COVID19 vaccines. To help with that concern, WE ARE OFFERING THE COVID19 VACCINES IN OUR OFFICE! Ask the front desk for dates!     Like [place] on [employer] and Instagram for our latest updates!      A healthy democracy works best when [employer] participate! Make sure you are registered to vote! If you have moved or changed any of your contact information, you will need to get this updated before voting!  In some cases, you MAY be able to register to vote online: [email or web address]

## 2020-04-11 LAB — MILK COMPONENT PANEL
F076-IgE Alpha Lactalbumin: <0.1 kU/L
F077-IgE Beta Lactoglobulin: <0.1 kU/L
F078-IgE Casein: <0.1 kU/L

## 2020-04-11 LAB — TRYPTASE: Tryptase: 6.4 ug/L (ref 2.2–13.2)

## 2020-04-11 LAB — EGG COMPONENT PANEL
F232-IgE Ovalbumin: <0.1 kU/L
F233-IgE Ovomucoid: <0.1 kU/L

## 2020-04-12 LAB — ALPHA-GAL PANEL
Allergen Lamb IgE: 0.13 kU/L — AB
Beef IgE: 0.8 kU/L — AB
IgE (Immunoglobulin E), Serum: 126 IU/mL (ref 6–495)
O215-IgE Alpha-Gal: 2.67 kU/L — AB
Pork IgE: 0.16 kU/L — AB

## 2020-06-02 ENCOUNTER — Ambulatory Visit (INDEPENDENT_AMBULATORY_CARE_PROVIDER_SITE_OTHER): Payer: 59

## 2020-06-02 ENCOUNTER — Other Ambulatory Visit: Payer: Self-pay

## 2020-06-02 DIAGNOSIS — J4550 Severe persistent asthma, uncomplicated: Secondary | ICD-10-CM | POA: Diagnosis not present

## 2020-07-18 ENCOUNTER — Other Ambulatory Visit: Payer: Self-pay | Admitting: Gastroenterology

## 2020-07-18 DIAGNOSIS — K219 Gastro-esophageal reflux disease without esophagitis: Secondary | ICD-10-CM

## 2020-07-31 ENCOUNTER — Ambulatory Visit: Payer: Self-pay

## 2020-08-07 ENCOUNTER — Ambulatory Visit (INDEPENDENT_AMBULATORY_CARE_PROVIDER_SITE_OTHER): Payer: 59 | Admitting: *Deleted

## 2020-08-07 ENCOUNTER — Other Ambulatory Visit: Payer: Self-pay

## 2020-08-07 DIAGNOSIS — J4550 Severe persistent asthma, uncomplicated: Secondary | ICD-10-CM

## 2020-09-01 ENCOUNTER — Other Ambulatory Visit: Payer: Self-pay | Admitting: Allergy & Immunology

## 2020-09-17 ENCOUNTER — Other Ambulatory Visit: Payer: Self-pay | Admitting: Allergy & Immunology

## 2020-10-02 ENCOUNTER — Ambulatory Visit: Payer: Self-pay

## 2020-10-14 ENCOUNTER — Other Ambulatory Visit: Payer: Self-pay

## 2020-10-14 ENCOUNTER — Ambulatory Visit (INDEPENDENT_AMBULATORY_CARE_PROVIDER_SITE_OTHER): Payer: 59 | Admitting: *Deleted

## 2020-10-14 DIAGNOSIS — J4550 Severe persistent asthma, uncomplicated: Secondary | ICD-10-CM

## 2020-12-05 ENCOUNTER — Other Ambulatory Visit: Payer: Self-pay

## 2020-12-05 ENCOUNTER — Ambulatory Visit (INDEPENDENT_AMBULATORY_CARE_PROVIDER_SITE_OTHER): Payer: 59

## 2020-12-05 DIAGNOSIS — J4550 Severe persistent asthma, uncomplicated: Secondary | ICD-10-CM

## 2021-01-15 ENCOUNTER — Other Ambulatory Visit: Payer: Self-pay | Admitting: Gastroenterology

## 2021-01-15 ENCOUNTER — Telehealth: Payer: Self-pay | Admitting: Allergy & Immunology

## 2021-01-15 DIAGNOSIS — K219 Gastro-esophageal reflux disease without esophagitis: Secondary | ICD-10-CM

## 2021-01-15 NOTE — Telephone Encounter (Signed)
Called patient to see if he is out of his advair it was last sent in 12/31/20. I left a message for him to call the office back.

## 2021-01-16 NOTE — Telephone Encounter (Signed)
noted 

## 2021-01-16 NOTE — Telephone Encounter (Signed)
Patient states he is fine on his Advair and does not need it filled at this time. I told him to give [place] a call if he needs it refilled in the future. He said he would.

## 2021-01-27 ENCOUNTER — Encounter: Payer: Self-pay | Admitting: Internal Medicine

## 2021-01-28 ENCOUNTER — Ambulatory Visit (INDEPENDENT_AMBULATORY_CARE_PROVIDER_SITE_OTHER): Payer: 59

## 2021-01-28 ENCOUNTER — Other Ambulatory Visit: Payer: Self-pay

## 2021-01-28 DIAGNOSIS — J4550 Severe persistent asthma, uncomplicated: Secondary | ICD-10-CM | POA: Diagnosis not present

## 2021-01-29 ENCOUNTER — Ambulatory Visit: Payer: 59

## 2021-02-03 DIAGNOSIS — Z79899 Other long term (current) drug therapy: Secondary | ICD-10-CM | POA: Diagnosis not present

## 2021-02-03 DIAGNOSIS — R69 Illness, unspecified: Secondary | ICD-10-CM | POA: Diagnosis not present

## 2021-02-03 DIAGNOSIS — F1120 Opioid dependence, uncomplicated: Secondary | ICD-10-CM | POA: Diagnosis not present

## 2021-02-03 DIAGNOSIS — F419 Anxiety disorder, unspecified: Secondary | ICD-10-CM | POA: Diagnosis not present

## 2021-02-03 DIAGNOSIS — G4700 Insomnia, unspecified: Secondary | ICD-10-CM | POA: Diagnosis not present

## 2021-02-03 DIAGNOSIS — M47812 Spondylosis without myelopathy or radiculopathy, cervical region: Secondary | ICD-10-CM | POA: Diagnosis not present

## 2021-02-27 ENCOUNTER — Other Ambulatory Visit: Payer: Self-pay

## 2021-02-27 ENCOUNTER — Ambulatory Visit (INDEPENDENT_AMBULATORY_CARE_PROVIDER_SITE_OTHER): Payer: 59 | Admitting: Allergy & Immunology

## 2021-02-27 ENCOUNTER — Encounter: Payer: Self-pay | Admitting: Allergy & Immunology

## 2021-02-27 VITALS — BP 126/82 | HR 66 | Temp 97.2°F | Resp 18

## 2021-02-27 DIAGNOSIS — K9049 Malabsorption due to intolerance, not elsewhere classified: Secondary | ICD-10-CM | POA: Diagnosis not present

## 2021-02-27 DIAGNOSIS — J3089 Other allergic rhinitis: Secondary | ICD-10-CM

## 2021-02-27 DIAGNOSIS — K219 Gastro-esophageal reflux disease without esophagitis: Secondary | ICD-10-CM

## 2021-02-27 DIAGNOSIS — J4550 Severe persistent asthma, uncomplicated: Secondary | ICD-10-CM | POA: Diagnosis not present

## 2021-02-27 DIAGNOSIS — J331 Polypoid sinus degeneration: Secondary | ICD-10-CM

## 2021-02-27 DIAGNOSIS — J302 Other seasonal allergic rhinitis: Secondary | ICD-10-CM

## 2021-02-27 MED ORDER — XHANCE 93 MCG/ACT NA EXHU: INHALANT_SUSPENSION | NASAL | 5 refills | Status: DC

## 2021-02-27 NOTE — Patient Instructions (Addendum)
1. Severe persistent asthma - improved on Fasenra  - Lung testing looked stable-ish today.  - We can continue with the same medications for now.  - We can consider changing to Xolair which might help you to be able to tolerate more foods.  - Daily controller medication(s): Breztri 2 puffs twice daily + Fasenra every 8 weeks - Prior to physical activity: ProAir 2 puffs 10-15 minutes before physical activity. - Rescue medications: ProAir 4 puffs every 4-6 hours as needed or albuterol nebulizer one vial puffs every 4-6 hours as needed - Asthma control goals:  * Full participation in all desired activities (may need albuterol before activity) * Albuterol use two time or less a week on average (not counting use with activity) * Cough interfering with sleep two time or less a month * Oral steroids no more than once a year * No hospitalizations  2. Seasonal and perennial allergic rhinitis  - Continue with Xhance 2 sprays per nostril in the morning and night.  - Continue with ipratropium once spray per nostril up to four times daily for RUNNY NOSE.   3. Food allergies - Continue to avoid all of your triggering foods. - EpiPen is up to date.   4. Return in about 4 months (around 06/27/2021).    Please inform us of any Emergency Department visits, hospitalizations, or changes in symptoms. Call us before going to the ED for breathing or allergy symptoms since we might be able to fit you in for a sick visit. Feel free to contact us anytime with any questions, problems, or concerns.  It was a pleasure to see you again today!  Websites that have reliable patient information: 1. American Academy of Asthma, Allergy, and Immunology: www.aaaai.org 2. Food Allergy Research and Education (FARE): foodallergy.org 3. Mothers of Asthmatics: http://www.asthmacommunitynetwork.org 4. American College of Allergy, Asthma, and Immunology: www.acaai.org   COVID-19 Vaccine Information can be found at:  [email or web address] For questions related to vaccine distribution or appointments, please email [email or web address] or call [phone].   We realize that you might be concerned about having an allergic reaction to the COVID19 vaccines. To help with that concern, WE ARE OFFERING THE COVID19 VACCINES IN OUR OFFICE! Ask the front desk for dates!     Like [place] on [employer] and Instagram for our latest updates!      A healthy democracy works best when [employer] participate! Make sure you are registered to vote! If you have moved or changed any of your contact information, you will need to get this updated before voting!  In some cases, you MAY be able to register to vote online: [email or web address]

## 2021-02-27 NOTE — Progress Notes (Addendum)
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 Severe persistent asthma - improved on Fasenra  - Lung testing looked stable-ish today.  - We can continue with the same medications for now.  - We can consider changing to Xolair which might help you to be able to tolerate more foods.  - Daily controller medication(s): Breztri 2 puffs twice daily + Fasenra every 8 weeks - Prior to physical activity: ProAir 2 puffs 10-15 minutes before physical activity. - Rescue medications: ProAir 4 puffs every 4-6 hours as needed or albuterol nebulizer one vial puffs every 4-6 hours as needed - Asthma control goals:  * Full participation in all desired activities (may need albuterol before activity) * Albuterol use two time or less a week on average (not counting use with activity) * Cough interfering with sleep two time or less a month * Oral steroids no more than once a year * No hospitalizations  2. Seasonal and perennial allergic rhinitis  - Continue with Xhance 2 sprays per nostril in the morning and night.  - Continue with ipratropium once spray per nostril up to four times daily for RUNNY NOSE.   3. Food allergies - Continue to avoid all of your triggering foods. - EpiPen is up to date.   4. Return in about 4 months (around 06/27/2021).    Subjective:   Maurice Richards is a 66 y.o. male presenting today for follow up of  Chief Complaint  Patient presents with   Follow-up    Maurice Richards has a history of the following: Patient Active Problem List   Diagnosis Date Noted   Severe persistent asthma, uncomplicated 76/16/0737   Eosinophilia  01/27/2017   Seasonal and perennial allergic rhinitis 10/15/2016   Moderate persistent asthma, uncomplicated 10/62/6948   Third degree hemorrhoids    Reflux esophagitis    Hiatal hernia    GERD (gastroesophageal reflux disease) 12/31/2014   Dysphagia 12/31/2014   History of colonic polyps 12/31/2014    History obtained from: chart review and patient.  Maurice Richards is a 66 y.o. male presenting for a follow up visit.  He was last seen in March 2022.  At that time, he was not using his controller medication routinely because of price.  We started Advair 250/50 micrograms 1 puff twice daily and continued the Fasenra every 8 weeks.  For his allergic rhinitis, we continue with Xhance 2 sprays per nostril twice daily as well as ipratropium 1 spray per nostril up to 4 times daily with runny noses.  We obtained an alpha gal panel as well as a medical panel and egg panel.  We gave him a list of the food allergy testing that we offer and he was going to [facility] and we could do testing at the next visit.  We asked him to come back in 4 months but this and he presents today around a year later.  In the interim, it looks like he has seen Dr. Verlin Richards at [facility].  His last visit with him was 1 week ago.  He had an alpha gal panel that was still positive to the entire panel with an IgE to alpha  gal of 2.29.  He had prick testing of food panel that was positive to pork but was otherwise negative.  Asthma/Respiratory Symptom History: He restarted the Singulair around 6-8 months ago. He thinks that this has helped quite a bit. I reviewed our past notes and we had stopped the Singulair to try to simplify his regimen.  He is on the [place] two puffs twice daily. He is on the [place] every 2 months.   Allergic Rhinitis Symptom History: He remains on the [place].  This seems to be working very well. This has been a life saver for him. He has never been on allergen immunotherapy, although we have discussed this on  a number of occasions. The last skin testing that we have is from December 2012 and he was positive to trees, outdoor molds, dog, dust mite.  He also had blood testing for environmental allergies sent during his extensive workup in [facility] in 2018 that demonstrated positives to cat and dog.   Food Allergy Symptom History: He continues to avoid beef and pork. He ended up having a lot of stomach pain for this. He does not tolerate marshmallows. He was actually told to avoid all mammalian meats for 21 days and then introduce red meats slowly over time.  He did not have anything enlightening from the workup with Dr. Verlin Richards at [facility].  Otherwise, there have been no changes to his past medical history, surgical history, family history, or social history.    Review of Systems  Constitutional: Negative.  Negative for chills, fever, malaise/fatigue and weight loss.  HENT:  Positive for congestion and sinus pain. Negative for ear discharge and ear pain.   Eyes:  Negative for pain, discharge and redness.  Respiratory:  Positive for cough. Negative for sputum production, shortness of breath and wheezing.   Cardiovascular: Negative.  Negative for chest pain and palpitations.  Gastrointestinal:  Negative for abdominal pain, constipation, diarrhea, heartburn, nausea and vomiting.  Skin: Negative.  Negative for itching and rash.  Neurological:  Negative for dizziness and headaches.  Endo/Heme/Allergies:  Positive for environmental allergies. Does not bruise/bleed easily.      Objective:   Blood pressure 126/82, pulse 66, temperature (!) 97.2 F (36.2 C), temperature source Temporal, resp. rate 18, SpO2 94 %. There is no height or weight on file to calculate BMI.   Physical Exam:  Physical Exam Vitals reviewed.  Constitutional:      Appearance: He is well-developed.     Comments: Extremely talkative. Delightful.   HENT:     Head: Normocephalic and atraumatic.     Right Ear:  Tympanic membrane, ear canal and external ear normal.     Left Ear: Tympanic membrane, ear canal and external ear normal.     Nose: No nasal deformity, septal deviation, mucosal edema or rhinorrhea.     Right Turbinates: Enlarged, swollen and pale.     Left Turbinates: Enlarged, swollen and pale.     Right Sinus: No maxillary sinus tenderness or frontal sinus tenderness.     Left Sinus: No maxillary sinus tenderness or frontal sinus tenderness.     Comments: Sinus polypoid degeneration present.    Mouth/Throat:     Mouth: Mucous membranes are not pale and not dry.     Pharynx: Uvula midline.  Eyes:     General:        Right eye: No discharge.        Left eye: No discharge.     Conjunctiva/sclera:  Conjunctivae normal.     Right eye: Right conjunctiva is not injected. No chemosis.    Left eye: Left conjunctiva is not injected. No chemosis.    Pupils: Pupils are equal, round, and reactive to light.  Cardiovascular:     Rate and Rhythm: Normal rate and regular rhythm.     Heart sounds: Normal heart sounds.  Pulmonary:     Effort: Pulmonary effort is normal. No tachypnea, accessory muscle usage or respiratory distress.     Breath sounds: Normal breath sounds. No wheezing, rhonchi or rales.     Comments: Moving air well in all lung fields.  No increased work of breathing. Chest:     Chest wall: No tenderness.  Lymphadenopathy:     Cervical: No cervical adenopathy.  Skin:    General: Skin is warm.     Capillary Refill: Capillary refill takes less than 2 seconds.     Coloration: Skin is not pale.     Findings: No abrasion, erythema, petechiae or rash. Rash is not papular, urticarial or vesicular.     Comments: No eczematous or urticarial lesions noted.  Neurological:     Mental Status: He is alert.  Psychiatric:        Behavior: Behavior is cooperative.     Diagnostic studies:    Spirometry: results abnormal (FEV1: 2.38/63%, FVC: 3.06/61%, FEV1/FVC: 78%).    Spirometry  consistent with possible restrictive disease.   Allergy Studies: none        Salvatore Marvel, MD  [facility] [place] [facility] [place]

## 2021-03-03 ENCOUNTER — Encounter: Payer: Self-pay | Admitting: Allergy & Immunology

## 2021-03-05 ENCOUNTER — Telehealth: Payer: Self-pay | Admitting: *Deleted

## 2021-03-05 NOTE — Telephone Encounter (Signed)
PA has been submitted through CoverMyMeds for Xhance and is currently pending approval/denial.  

## 2021-03-06 NOTE — Telephone Encounter (Signed)
PA has denied for [facility] stating that the patient must try and fail flunisolide, mometasone, triamcinolone, and omnaris. Please advise change in medication. Thank you.

## 2021-03-09 NOTE — Telephone Encounter (Signed)
Did you use J33.1?   We can change mometasone two sprays per nostril BID.  Salvatore Marvel, MD [facility] [facility] [place]

## 2021-03-10 NOTE — Telephone Encounter (Signed)
No sir I did not because that was not the diagnosis that was in his most recent office note. Does he have a history of nasal polyps?

## 2021-03-12 ENCOUNTER — Encounter: Payer: Self-pay | Admitting: Allergy & Immunology

## 2021-03-12 NOTE — Telephone Encounter (Signed)
Will re-attempt prior authorization tomorrow.

## 2021-03-12 NOTE — Telephone Encounter (Signed)
I fixed the note! His nose is a mess. I think the sinus polypoid degeneration is applicable.  Salvatore Marvel, MD [facility] [place] [facility] [place]

## 2021-03-16 ENCOUNTER — Other Ambulatory Visit: Payer: Self-pay | Admitting: Allergy & Immunology

## 2021-03-16 NOTE — Telephone Encounter (Signed)
Will need to call patient and see if his insurance has changed because when I attempt the PA now it states that the patient does not having active insurance. Will call patient tomorrow and try to determine what is going on.

## 2021-03-16 NOTE — Telephone Encounter (Signed)
Did you re-attempt PA?

## 2021-03-19 NOTE — Telephone Encounter (Signed)
Patient called back and advised to [facility] that his insurance is current and [place] confirmed. Will call his insurance and initiate PA.

## 2021-03-19 NOTE — Telephone Encounter (Signed)
Called and left a voicemail asking for patient to return call to see if there has been a change in insurance.

## 2021-03-20 ENCOUNTER — Other Ambulatory Visit: Payer: Self-pay | Admitting: *Deleted

## 2021-03-20 MED ORDER — XHANCE 93 MCG/ACT NA EXHU: INHALANT_SUSPENSION | NASAL | 5 refills | Status: DC

## 2021-03-20 NOTE — Telephone Encounter (Signed)
Re-sent prescription for Xhance to [facility] pharmacy to regenerate another PA request. Will follow up.

## 2021-03-31 ENCOUNTER — Ambulatory Visit (INDEPENDENT_AMBULATORY_CARE_PROVIDER_SITE_OTHER): Payer: 59

## 2021-03-31 ENCOUNTER — Other Ambulatory Visit: Payer: Self-pay

## 2021-03-31 DIAGNOSIS — J4550 Severe persistent asthma, uncomplicated: Secondary | ICD-10-CM

## 2021-04-14 ENCOUNTER — Other Ambulatory Visit: Payer: Self-pay | Admitting: Allergy & Immunology

## 2021-04-28 DIAGNOSIS — M47812 Spondylosis without myelopathy or radiculopathy, cervical region: Secondary | ICD-10-CM | POA: Diagnosis not present

## 2021-04-28 DIAGNOSIS — F419 Anxiety disorder, unspecified: Secondary | ICD-10-CM | POA: Diagnosis not present

## 2021-04-28 DIAGNOSIS — Z79899 Other long term (current) drug therapy: Secondary | ICD-10-CM | POA: Diagnosis not present

## 2021-04-28 DIAGNOSIS — F1120 Opioid dependence, uncomplicated: Secondary | ICD-10-CM | POA: Diagnosis not present

## 2021-04-28 DIAGNOSIS — G5603 Carpal tunnel syndrome, bilateral upper limbs: Secondary | ICD-10-CM | POA: Diagnosis not present

## 2021-04-28 DIAGNOSIS — R69 Illness, unspecified: Secondary | ICD-10-CM | POA: Diagnosis not present

## 2021-04-28 DIAGNOSIS — G4700 Insomnia, unspecified: Secondary | ICD-10-CM | POA: Diagnosis not present

## 2021-04-29 ENCOUNTER — Other Ambulatory Visit: Payer: Self-pay

## 2021-04-29 MED ORDER — EPINEPHRINE 0.3 MG/0.3ML IJ SOAJ: 0.3000 mg | INTRAMUSCULAR | 1 refills | Status: AC | PRN

## 2021-05-16 ENCOUNTER — Other Ambulatory Visit: Payer: Self-pay | Admitting: Allergy & Immunology

## 2021-05-29 ENCOUNTER — Ambulatory Visit (INDEPENDENT_AMBULATORY_CARE_PROVIDER_SITE_OTHER): Payer: 59

## 2021-05-29 DIAGNOSIS — J4550 Severe persistent asthma, uncomplicated: Secondary | ICD-10-CM | POA: Diagnosis not present

## 2021-06-01 DIAGNOSIS — J4550 Severe persistent asthma, uncomplicated: Secondary | ICD-10-CM | POA: Diagnosis not present

## 2021-06-01 DIAGNOSIS — Z6829 Body mass index (BMI) 29.0-29.9, adult: Secondary | ICD-10-CM | POA: Diagnosis not present

## 2021-06-01 DIAGNOSIS — J302 Other seasonal allergic rhinitis: Secondary | ICD-10-CM | POA: Diagnosis not present

## 2021-06-01 DIAGNOSIS — G894 Chronic pain syndrome: Secondary | ICD-10-CM | POA: Diagnosis not present

## 2021-06-01 DIAGNOSIS — E663 Overweight: Secondary | ICD-10-CM | POA: Diagnosis not present

## 2021-06-01 DIAGNOSIS — K219 Gastro-esophageal reflux disease without esophagitis: Secondary | ICD-10-CM | POA: Diagnosis not present

## 2021-06-01 DIAGNOSIS — I10 Essential (primary) hypertension: Secondary | ICD-10-CM | POA: Diagnosis not present

## 2021-06-28 ENCOUNTER — Encounter (HOSPITAL_COMMUNITY): Payer: Self-pay | Admitting: *Deleted

## 2021-06-28 ENCOUNTER — Inpatient Hospital Stay (HOSPITAL_COMMUNITY)
Admission: EM | Admit: 2021-06-28 | Discharge: 2021-07-02 | DRG: 964 | Disposition: A | Discharge status: Home / Self Care | Payer: 59 | Attending: General Surgery | Admitting: General Surgery

## 2021-06-28 ENCOUNTER — Emergency Department (HOSPITAL_COMMUNITY): Payer: 59

## 2021-06-28 ENCOUNTER — Other Ambulatory Visit: Payer: Self-pay

## 2021-06-28 DIAGNOSIS — M778 Other enthesopathies, not elsewhere classified: Secondary | ICD-10-CM | POA: Diagnosis not present

## 2021-06-28 DIAGNOSIS — J90 Pleural effusion, not elsewhere classified: Secondary | ICD-10-CM | POA: Diagnosis not present

## 2021-06-28 DIAGNOSIS — R600 Localized edema: Secondary | ICD-10-CM | POA: Diagnosis not present

## 2021-06-28 DIAGNOSIS — M1711 Unilateral primary osteoarthritis, right knee: Secondary | ICD-10-CM | POA: Diagnosis present

## 2021-06-28 DIAGNOSIS — J4540 Moderate persistent asthma, uncomplicated: Secondary | ICD-10-CM | POA: Diagnosis not present

## 2021-06-28 DIAGNOSIS — Z87891 Personal history of nicotine dependence: Secondary | ICD-10-CM | POA: Diagnosis not present

## 2021-06-28 DIAGNOSIS — S83411A Sprain of medial collateral ligament of right knee, initial encounter: Secondary | ICD-10-CM | POA: Diagnosis not present

## 2021-06-28 DIAGNOSIS — S299XXA Unspecified injury of thorax, initial encounter: Secondary | ICD-10-CM | POA: Diagnosis not present

## 2021-06-28 DIAGNOSIS — J45909 Unspecified asthma, uncomplicated: Secondary | ICD-10-CM | POA: Diagnosis not present

## 2021-06-28 DIAGNOSIS — S42192A Fracture of other part of scapula, left shoulder, initial encounter for closed fracture: Secondary | ICD-10-CM | POA: Diagnosis not present

## 2021-06-28 DIAGNOSIS — F419 Anxiety disorder, unspecified: Secondary | ICD-10-CM | POA: Diagnosis present

## 2021-06-28 DIAGNOSIS — R9431 Abnormal electrocardiogram [ECG] [EKG]: Secondary | ICD-10-CM | POA: Diagnosis not present

## 2021-06-28 DIAGNOSIS — S72431A Displaced fracture of medial condyle of right femur, initial encounter for closed fracture: Secondary | ICD-10-CM | POA: Diagnosis present

## 2021-06-28 DIAGNOSIS — R69 Illness, unspecified: Secondary | ICD-10-CM | POA: Diagnosis not present

## 2021-06-28 DIAGNOSIS — S271XXA Traumatic hemothorax, initial encounter: Secondary | ICD-10-CM | POA: Diagnosis not present

## 2021-06-28 DIAGNOSIS — S0990XA Unspecified injury of head, initial encounter: Secondary | ICD-10-CM | POA: Diagnosis not present

## 2021-06-28 DIAGNOSIS — S72411A Displaced unspecified condyle fracture of lower end of right femur, initial encounter for closed fracture: Secondary | ICD-10-CM | POA: Diagnosis not present

## 2021-06-28 DIAGNOSIS — M48061 Spinal stenosis, lumbar region without neurogenic claudication: Secondary | ICD-10-CM | POA: Diagnosis not present

## 2021-06-28 DIAGNOSIS — M5020 Other cervical disc displacement, unspecified cervical region: Secondary | ICD-10-CM | POA: Diagnosis not present

## 2021-06-28 DIAGNOSIS — M5126 Other intervertebral disc displacement, lumbar region: Secondary | ICD-10-CM | POA: Diagnosis not present

## 2021-06-28 DIAGNOSIS — M5136 Other intervertebral disc degeneration, lumbar region: Secondary | ICD-10-CM | POA: Diagnosis not present

## 2021-06-28 DIAGNOSIS — S199XXA Unspecified injury of neck, initial encounter: Secondary | ICD-10-CM | POA: Diagnosis not present

## 2021-06-28 DIAGNOSIS — J3089 Other allergic rhinitis: Secondary | ICD-10-CM | POA: Diagnosis not present

## 2021-06-28 DIAGNOSIS — D3501 Benign neoplasm of right adrenal gland: Secondary | ICD-10-CM | POA: Diagnosis not present

## 2021-06-28 DIAGNOSIS — J9811 Atelectasis: Secondary | ICD-10-CM | POA: Diagnosis not present

## 2021-06-28 DIAGNOSIS — M25561 Pain in right knee: Secondary | ICD-10-CM | POA: Diagnosis not present

## 2021-06-28 DIAGNOSIS — R0789 Other chest pain: Secondary | ICD-10-CM | POA: Diagnosis not present

## 2021-06-28 DIAGNOSIS — S83231A Complex tear of medial meniscus, current injury, right knee, initial encounter: Secondary | ICD-10-CM | POA: Diagnosis not present

## 2021-06-28 DIAGNOSIS — D3502 Benign neoplasm of left adrenal gland: Secondary | ICD-10-CM | POA: Diagnosis not present

## 2021-06-28 DIAGNOSIS — S42102A Fracture of unspecified part of scapula, left shoulder, initial encounter for closed fracture: Secondary | ICD-10-CM | POA: Diagnosis not present

## 2021-06-28 DIAGNOSIS — S2242XA Multiple fractures of ribs, left side, initial encounter for closed fracture: Secondary | ICD-10-CM | POA: Diagnosis not present

## 2021-06-28 DIAGNOSIS — J4550 Severe persistent asthma, uncomplicated: Secondary | ICD-10-CM | POA: Diagnosis not present

## 2021-06-28 DIAGNOSIS — S72416A Nondisplaced unspecified condyle fracture of lower end of unspecified femur, initial encounter for closed fracture: Secondary | ICD-10-CM | POA: Diagnosis not present

## 2021-06-28 DIAGNOSIS — D721 Eosinophilia, unspecified: Secondary | ICD-10-CM | POA: Diagnosis not present

## 2021-06-28 DIAGNOSIS — M79622 Pain in left upper arm: Secondary | ICD-10-CM | POA: Diagnosis not present

## 2021-06-28 DIAGNOSIS — Z041 Encounter for examination and observation following transport accident: Secondary | ICD-10-CM | POA: Diagnosis not present

## 2021-06-28 DIAGNOSIS — M4316 Spondylolisthesis, lumbar region: Secondary | ICD-10-CM | POA: Diagnosis not present

## 2021-06-28 DIAGNOSIS — S42112A Displaced fracture of body of scapula, left shoulder, initial encounter for closed fracture: Secondary | ICD-10-CM | POA: Diagnosis not present

## 2021-06-28 DIAGNOSIS — M47812 Spondylosis without myelopathy or radiculopathy, cervical region: Secondary | ICD-10-CM | POA: Diagnosis not present

## 2021-06-28 DIAGNOSIS — M7989 Other specified soft tissue disorders: Secondary | ICD-10-CM | POA: Diagnosis not present

## 2021-06-28 DIAGNOSIS — Z23 Encounter for immunization: Secondary | ICD-10-CM

## 2021-06-28 DIAGNOSIS — J942 Hemothorax: Secondary | ICD-10-CM | POA: Diagnosis not present

## 2021-06-28 DIAGNOSIS — M4317 Spondylolisthesis, lumbosacral region: Secondary | ICD-10-CM | POA: Diagnosis not present

## 2021-06-28 DIAGNOSIS — S83421A Sprain of lateral collateral ligament of right knee, initial encounter: Secondary | ICD-10-CM | POA: Diagnosis not present

## 2021-06-28 DIAGNOSIS — S3991XA Unspecified injury of abdomen, initial encounter: Secondary | ICD-10-CM | POA: Diagnosis not present

## 2021-06-28 DIAGNOSIS — S2241XA Multiple fractures of ribs, right side, initial encounter for closed fracture: Secondary | ICD-10-CM | POA: Diagnosis not present

## 2021-06-28 DIAGNOSIS — M47816 Spondylosis without myelopathy or radiculopathy, lumbar region: Secondary | ICD-10-CM | POA: Diagnosis not present

## 2021-06-28 DIAGNOSIS — M25461 Effusion, right knee: Secondary | ICD-10-CM | POA: Diagnosis not present

## 2021-06-28 DIAGNOSIS — S42115A Nondisplaced fracture of body of scapula, left shoulder, initial encounter for closed fracture: Secondary | ICD-10-CM | POA: Diagnosis not present

## 2021-06-28 LAB — CBC
HCT: 45.1 % (ref 39.0–52.0)
Hemoglobin: 14.2 g/dL (ref 13.0–17.0)
MCH: 30.5 pg (ref 26.0–34.0)
MCHC: 31.5 g/dL (ref 30.0–36.0)
MCV: 96.8 fL (ref 80.0–100.0)
Platelets: 205 10*3/uL (ref 150–400)
RBC: 4.66 MIL/uL (ref 4.22–5.81)
RDW: 11.9 % (ref 11.5–15.5)
WBC: 14.8 10*3/uL — ABNORMAL HIGH (ref 4.0–10.5)
nRBC: 0 % (ref 0.0–0.2)

## 2021-06-28 LAB — TROPONIN I (HIGH SENSITIVITY)
Troponin I (High Sensitivity): 3 ng/L (ref <18)
Troponin I (High Sensitivity): 2 ng/L (ref <18)

## 2021-06-28 LAB — COMPREHENSIVE METABOLIC PANEL
ALT: 20 U/L (ref 0–44)
AST: 24 U/L (ref 15–41)
Albumin: 4 g/dL (ref 3.5–5.0)
Alkaline Phosphatase: 75 U/L (ref 38–126)
Anion gap: 6 (ref 5–15)
BUN: 18 mg/dL (ref 8–23)
CO2: 26 mmol/L (ref 22–32)
Calcium: 8.5 mg/dL — ABNORMAL LOW (ref 8.9–10.3)
Chloride: 105 mmol/L (ref 98–111)
Creatinine, Ser: 1.02 mg/dL (ref 0.61–1.24)
GFR, Estimated: >60 mL/min (ref >60)
Glucose, Bld: 185 mg/dL — ABNORMAL HIGH (ref 70–99)
Potassium: 4 mmol/L (ref 3.5–5.1)
Sodium: 137 mmol/L (ref 135–145)
Total Bilirubin: 0.2 mg/dL — ABNORMAL LOW (ref 0.3–1.2)
Total Protein: 6.6 g/dL (ref 6.5–8.1)

## 2021-06-28 MED ORDER — FENTANYL CITRATE PF 50 MCG/ML IJ SOSY
100.0000 ug | PREFILLED_SYRINGE | Freq: Once | INTRAMUSCULAR | Status: AC
  Filled 2021-06-28: qty 2

## 2021-06-28 MED ORDER — METHOCARBAMOL 500 MG PO TABS
500.0000 mg | ORAL_TABLET | Freq: Three times a day (TID) | ORAL | Status: DC | PRN
  Administered 2021-06-29: 500 mg via ORAL
  Filled 2021-06-28: qty 1

## 2021-06-28 MED ORDER — FENTANYL CITRATE PF 50 MCG/ML IJ SOSY
100.0000 ug | PREFILLED_SYRINGE | Freq: Once | INTRAMUSCULAR | Status: AC
  Administered 2021-06-28: 100 ug via INTRAVENOUS
  Filled 2021-06-28: qty 2

## 2021-06-28 MED ORDER — FENTANYL CITRATE PF 50 MCG/ML IJ SOSY
50.0000 ug | PREFILLED_SYRINGE | Freq: Once | INTRAMUSCULAR | Status: AC
  Administered 2021-06-28: 50 ug via INTRAVENOUS
  Filled 2021-06-28: qty 1

## 2021-06-28 MED ORDER — ENOXAPARIN SODIUM 30 MG/0.3ML IJ SOSY
30.0000 mg | PREFILLED_SYRINGE | Freq: Two times a day (BID) | INTRAMUSCULAR | Status: DC
  Administered 2021-06-29 – 2021-07-02 (×7): 30 mg via SUBCUTANEOUS
  Filled 2021-06-28 (×7): qty 0.3

## 2021-06-28 MED ORDER — TETANUS-DIPHTH-ACELL PERTUSSIS 5-2.5-18.5 LF-MCG/0.5 IM SUSY
0.5000 mL | PREFILLED_SYRINGE | Freq: Once | INTRAMUSCULAR | Status: AC
  Administered 2021-06-28: 0.5 mL via INTRAMUSCULAR
  Filled 2021-06-28: qty 0.5

## 2021-06-28 MED ORDER — PANTOPRAZOLE SODIUM 40 MG PO TBEC
40.0000 mg | DELAYED_RELEASE_TABLET | Freq: Every day | ORAL | Status: DC
  Administered 2021-06-29 – 2021-07-02 (×4): 40 mg via ORAL
  Filled 2021-06-28 (×4): qty 1

## 2021-06-28 MED ORDER — DEXTROSE-NACL 5-0.9 % IV SOLN: INTRAVENOUS | Status: DC

## 2021-06-28 MED ORDER — FENTANYL CITRATE PF 50 MCG/ML IJ SOSY
PREFILLED_SYRINGE | INTRAMUSCULAR | Status: AC
  Administered 2021-06-28: 100 ug via INTRAVENOUS
  Filled 2021-06-28: qty 1

## 2021-06-28 MED ORDER — HYDROMORPHONE HCL 1 MG/ML IJ SOLN
1.0000 mg | INTRAMUSCULAR | Status: DC | PRN
  Administered 2021-06-28 – 2021-06-30 (×7): 1 mg via INTRAVENOUS
  Filled 2021-06-28 (×7): qty 1

## 2021-06-28 MED ORDER — HYDROCODONE-ACETAMINOPHEN 5-325 MG PO TABS
2.0000 | ORAL_TABLET | ORAL | Status: DC | PRN
  Filled 2021-06-28: qty 2

## 2021-06-28 MED ORDER — HYDROMORPHONE HCL 1 MG/ML IJ SOLN
1.0000 mg | Freq: Once | INTRAMUSCULAR | Status: AC
  Administered 2021-06-28: 1 mg via INTRAVENOUS
  Filled 2021-06-28: qty 1

## 2021-06-28 MED ORDER — METHOCARBAMOL 1000 MG/10ML IJ SOLN: 500.0000 mg | Freq: Three times a day (TID) | INTRAVENOUS | Status: DC | PRN

## 2021-06-28 MED ORDER — IOHEXOL 300 MG/ML  SOLN
100.0000 mL | Freq: Once | INTRAMUSCULAR | Status: AC | PRN
  Administered 2021-06-28: 100 mL via INTRAVENOUS

## 2021-06-28 MED ORDER — ONDANSETRON HCL 4 MG/2ML IJ SOLN: 4.0000 mg | Freq: Four times a day (QID) | INTRAMUSCULAR | Status: DC | PRN

## 2021-06-28 MED ORDER — ONDANSETRON 4 MG PO TBDP: 4.0000 mg | ORAL_TABLET | Freq: Four times a day (QID) | ORAL | Status: DC | PRN

## 2021-06-28 MED ORDER — PANTOPRAZOLE SODIUM 40 MG IV SOLR
40.0000 mg | Freq: Every day | INTRAVENOUS | Status: DC
  Administered 2021-06-29: 40 mg via INTRAVENOUS
  Filled 2021-06-28 (×2): qty 10

## 2021-06-28 NOTE — ED Notes (Signed)
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 (PROTONIX) injection 40 mg (has no administration in time range)  Tdap (BOOSTRIX) injection 0.5 mL (0.5 mLs Intramuscular Given 06/28/21 1401)  fentaNYL (SUBLIMAZE) injection 100 mcg (100 mcg Intravenous Given 06/28/21 1403)  iohexol (OMNIPAQUE) 300 MG/ML solution 100 mL (100 mLs Intravenous Contrast Given 06/28/21 1535)  fentaNYL (SUBLIMAZE) injection 100 mcg (100 mcg Intravenous Given 06/28/21 1523)  fentaNYL (SUBLIMAZE) injection 50 mcg (50 mcg Intravenous Given 06/28/21 1821)  HYDROmorphone (DILAUDID) injection 1 mg (1 mg Intravenous Given 06/28/21 1918)    Mobility walks with person assist     Focused Assessments Cardiac Assessment Handoff:  Cardiac Rhythm: Sinus bradycardia No results found for: CKTOTAL, CKMB, CKMBINDEX, TROPONINI No results found for: DDIMER Does the Patient currently have chest pain? No   , Neuro Assessment Handoff:  Swallow screen pass? Yes  Cardiac Rhythm: Sinus bradycardia       Neuro Assessment: Exceptions to [facility] Neuro Checks:      Last Documented NIHSS Modified Score:   Has TPA been given? No If patient  is a Neuro Trauma and patient is going to OR before floor call report to [place] nurse: [phone] or [phone]   R Recommendations: See Admitting Provider Note  Report given to:   Additional Notes: AAOx4, room air
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
## 2021-06-28 NOTE — ED Notes (Signed)
Belongings, wallet, and phone given to pts wife prior to transfer to [facility] ED

## 2021-06-28 NOTE — Plan of Care (Signed)
  Problem: Activity: Goal: Risk for activity intolerance will decrease Outcome: Progressing   Problem: Coping: Goal: Level of anxiety will decrease Outcome: Progressing   Problem: Elimination: Goal: Will not experience complications related to bowel motility Outcome: Progressing   Problem: Pain Managment: Goal: General experience of comfort will improve Outcome: Progressing   Problem: Skin Integrity: Goal: Risk for impaired skin integrity will decrease Outcome: Progressing

## 2021-06-28 NOTE — ED Notes (Signed)
Dr Mardelle Matte paged to Dr Roderic Palau
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
## 2021-06-28 NOTE — ED Notes (Signed)
Pt transported to CT ?
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
## 2021-06-29 ENCOUNTER — Encounter (HOSPITAL_COMMUNITY): Payer: Self-pay

## 2021-06-29 ENCOUNTER — Inpatient Hospital Stay (HOSPITAL_COMMUNITY): Payer: 59

## 2021-06-29 MED ORDER — MOMETASONE FURO-FORMOTEROL FUM 200-5 MCG/ACT IN AERO
2.0000 | INHALATION_SPRAY | Freq: Two times a day (BID) | RESPIRATORY_TRACT | Status: DC
  Administered 2021-06-29 – 2021-06-30 (×2): 2 via RESPIRATORY_TRACT
  Filled 2021-06-29 (×2): qty 8.8

## 2021-06-29 MED ORDER — MONTELUKAST SODIUM 10 MG PO TABS
10.0000 mg | ORAL_TABLET | Freq: Every day | ORAL | Status: DC
  Administered 2021-06-29 – 2021-07-01 (×3): 10 mg via ORAL
  Filled 2021-06-29 (×3): qty 1

## 2021-06-29 MED ORDER — ACETAMINOPHEN 500 MG PO TABS
1000.0000 mg | ORAL_TABLET | Freq: Four times a day (QID) | ORAL | Status: DC
  Administered 2021-06-29 – 2021-07-01 (×8): 1000 mg via ORAL
  Filled 2021-06-29 (×8): qty 2

## 2021-06-29 MED ORDER — BUDESON-GLYCOPYRROL-FORMOTEROL 160-9-4.8 MCG/ACT IN AERO: 2.0000 | INHALATION_SPRAY | Freq: Two times a day (BID) | RESPIRATORY_TRACT | Status: DC

## 2021-06-29 MED ORDER — FLUTICASONE PROPIONATE 50 MCG/ACT NA SUSP
2.0000 | Freq: Every day | NASAL | Status: DC
  Administered 2021-06-30: 2 via NASAL
  Filled 2021-06-29: qty 16

## 2021-06-29 MED ORDER — OXYCODONE HCL 5 MG PO TABS
5.0000 mg | ORAL_TABLET | ORAL | Status: DC | PRN
  Administered 2021-06-29 – 2021-06-30 (×4): 5 mg via ORAL
  Filled 2021-06-29 (×4): qty 1

## 2021-06-29 MED ORDER — DIPHENHYDRAMINE HCL 25 MG PO CAPS
25.0000 mg | ORAL_CAPSULE | Freq: Three times a day (TID) | ORAL | Status: DC | PRN
Start: 2021-06-29 — End: 2021-07-02
  Administered 2021-06-30 – 2021-07-02 (×4): 25 mg via ORAL
  Filled 2021-06-29 (×4): qty 1

## 2021-06-29 MED ORDER — ALBUTEROL SULFATE (2.5 MG/3ML) 0.083% IN NEBU
3.0000 mL | INHALATION_SOLUTION | RESPIRATORY_TRACT | Status: DC | PRN
  Administered 2021-06-30: 3 mL via RESPIRATORY_TRACT
  Filled 2021-06-29 (×2): qty 3

## 2021-06-29 MED ORDER — METHOCARBAMOL 500 MG PO TABS: 500.0000 mg | ORAL_TABLET | Freq: Three times a day (TID) | ORAL | Status: DC | PRN
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
## 2021-06-29 NOTE — TOC CAGE-AID Note (Signed)
Transition of Care [facility]) - CAGE-AID Screening   Patient Details  Name: Maurice Richards MRN: [id number] Date of Birth: [birth year]  Clinical Narrative:  Patient denies any substance abuse, some occasional ETOH use. No need for resources at this time.  CAGE-AID Screening:    Have You Ever Felt You Ought to Cut Down on Your Drinking or Drug Use?: No Have People Annoyed You By Critizing Your Drinking Or Drug Use?: No Have You Felt Bad Or Guilty About Your Drinking Or Drug Use?: No Have You Ever Had a Drink or Used Drugs First Thing In The Morning to Steady Your Nerves or to Get Rid of a Hangover?: No CAGE-AID Score: 0  Substance Abuse Education Offered: No

## 2021-06-29 NOTE — Progress Notes (Deleted)
Referring Provider: Redmond School, MD Primary Care Physician:  Redmond School, MD Primary GI Physician: Dr. Gala Romney  No chief complaint on file.   HPI:   Maurice Richards is a 66 y.o. male presenting today with GI history of GERD with reflux esophagitis, third-degree hemorrhoids noted on colonoscopy in 2016 s/p hemorrhoid banding in 2018.  He is presenting today for  ***  Last seen in our office December 2021.  His allergies have recently increase Protonix to twice daily due to increased cough likely secondary to GERD.  With this, he was feeling better.  Allergist also told him he likely had alpha gal.  He reported he could feel his hemorrhoids, but they were not bothering him.  Also reported he was previously hep C positive and underwent treatment in 1996.  He was advised to continue his current medications, follow-up in 1 year.  Today:      Past Medical History:  Diagnosis Date   Anxiety    Asthma    Cervical disc herniation    Headache(784.0)    from cervical disc   Hemorrhoids    Hepatitis    hep C   Plantar fasciitis    Tubular adenoma     Past Surgical History:  Procedure Laterality Date   COLONOSCOPY  02/24/2007   Dr.Rourk- friable anal canal hemorrhoids o/w normal rectum, diminutive mid descending colon polyps, a couple of submucosal petechiae on a couple folds in the L colon remainder of colonic mucosa appeared normal bx=tubular adenoma   COLONOSCOPY WITH PROPOFOL N/A 01/06/2015   Dr.Rourk- grade 3 hemorrhoids, normal colonoscopy   ESOPHAGOGASTRODUODENOSCOPY (EGD) WITH PROPOFOL N/A 01/06/2015   Dr.Rourk- erosive reflux esophagitis, small hiatal hernia   HEMORRHOID BANDING     INSERTION OF MESH N/A 10/31/2013   Procedure: INSERTION OF MESH;  Surgeon: Jamesetta So, MD;  Location: [facility];  Service: General;  Laterality: N/A;  Umbilical   KNEE ARTHROSCOPY Bilateral    MALONEY DILATION N/A 01/06/2015   Procedure: Venia Minks DILATION;  Surgeon: Daneil Dolin, MD;   Location: [facility];  Service: Endoscopy;  Laterality: N/A;   ROTATOR CUFF REPAIR Bilateral    TONSILLECTOMY     UMBILICAL HERNIA REPAIR N/A 10/31/2013   Procedure: UMBILICAL HERNIORRHAPHY WITH MESH;  Surgeon: Jamesetta So, MD;  Location: [facility] OR[facility];  Service: General;  Laterality: N/A;    No current facility-administered medications for this visit.   No current outpatient medications on file.   Facility-Administered Medications Ordered in Other Visits  Medication Dose Route Frequency Provider Last Rate Last Admin   acetaminophen (TYLENOL) tablet 1,000 mg  1,000 mg Oral Q6H Rolm Bookbinder, MD   1,000 mg at 06/29/21 0818   albuterol (PROVENTIL) (2.5 MG/3ML) 0.083% nebulizer solution 3 mL  3 mL Inhalation Q4H PRN Rolm Bookbinder, MD       dextrose 5 %-0.9 % sodium chloride infusion   Intravenous Continuous Rolm Bookbinder, MD 50 mL/hr at 06/29/21 0817 Rate Change at 06/29/21 0817   enoxaparin (LOVENOX) injection 30 mg  30 mg Subcutaneous Q12H Ralene Ok, MD   30 mg at 06/29/21 0818   fluticasone (FLONASE) 50 MCG/ACT nasal spray 2 spray  2 spray Each Nare Daily Rolm Bookbinder, MD       HYDROmorphone (DILAUDID) injection 1 mg  1 mg Intravenous Q2H PRN Ralene Ok, MD   1 mg at 06/29/21 1051   methocarbamol (ROBAXIN) tablet 500 mg  500 mg Oral Q8H PRN Ralene Ok, MD  500 mg at 06/29/21 0011   mometasone-formoterol (DULERA) 200-5 MCG/ACT inhaler 2 puff  2 puff Inhalation BID Rolm Bookbinder, MD       montelukast (SINGULAIR) tablet 10 mg  10 mg Oral QHS Rolm Bookbinder, MD       ondansetron (ZOFRAN-ODT) disintegrating tablet 4 mg  4 mg Oral Q6H PRN Ralene Ok, MD       Or   ondansetron [facility]) injection 4 mg  4 mg Intravenous Q6H PRN Ralene Ok, MD       oxyCODONE (Oxy IR/ROXICODONE) immediate release tablet 5 mg  5 mg Oral Q4H PRN Rolm Bookbinder, MD       pantoprazole (PROTONIX) EC tablet 40 mg  40 mg Oral Daily Ralene Ok, MD   40 mg  at 06/29/21 0818   Or   pantoprazole (PROTONIX) injection 40 mg  40 mg Intravenous Daily Ralene Ok, MD   40 mg at 06/29/21 0007    Allergies as of 07/01/2021 - Review Complete 06/28/2021  Allergen Reaction Noted   Beef-derived products  06/29/2021   Pork-derived products  06/29/2021   Alpha-gal  01/04/2020    Family History  Problem Relation Age of Onset   Prostate cancer Father    Colon cancer Neg Hx     Social History   Socioeconomic History   Marital status: Divorced    Spouse name: Not on file   Number of children: Not on file   Years of education: Not on file   Highest education level: Not on file  Occupational History   Not on file  Tobacco Use   Smoking status: Former    Packs/day: 1.50    Years: 10.00    Pack years: 15.00    Types: Cigarettes    Quit date: 01/01/1990    Years since quitting: 31.5   Smokeless tobacco: Former    Quit date: 10/22/1996  Vaping Use   Vaping Use: Never used  Substance and Sexual Activity   Alcohol use: Yes    Comment: seldom   Drug use: No   Sexual activity: Yes    Birth control/protection: None  Other Topics Concern   Not on file  Social History Narrative   Not on file   Social Determinants of Health   Financial Resource Strain: Not on file  Food Insecurity: Not on file  Transportation Needs: Not on file  Physical Activity: Not on file  Stress: Not on file  Social Connections: Not on file    Review of Systems: Gen: Denies fever, chills, cold or flulike symptoms, presyncope, syncope. CV: Denies chest pain, palpitations. Resp: Denies dyspnea, cough.  GI: See HPI Heme: See HPI  Physical Exam: There were no vitals taken for this visit. General:   Alert and oriented. No distress noted. Pleasant and cooperative.  Head:  Normocephalic and atraumatic. Eyes:  Conjuctiva clear without scleral icterus. Heart:  S1, S2 present without murmurs appreciated. Lungs:  Clear to auscultation bilaterally. No wheezes,  rales, or rhonchi. No distress.  Abdomen:  +BS, soft, non-tender and non-distended. No rebound or guarding. No HSM or masses noted. Msk:  Symmetrical without gross deformities. Normal posture. Extremities:  Without edema. Neurologic:  Alert and  oriented x4 Psych:  Normal mood and affect.    Assessment:     Plan:  ***   Aliene Altes, PA-C [facility] 07/01/2021

## 2021-06-29 NOTE — Progress Notes (Addendum)
Orthopedic Tech Progress Note Patient Details:  Maurice Richards 12-17-1955 [id number]  I also 2 ice packs on his shoulder to help with some of the discomfort he has going on. Just called in order to [employer] for a LOCKED BLEDSOE brace. Patient might not get brace until tomorrow due to holiday .  Ortho Devices Type of Ortho Device: Sling immobilizer Ortho Device/Splint Location: LUE Ortho Device/Splint Interventions: Ordered, Application, Adjustment   Post Interventions Patient Tolerated: Well Instructions Provided: Care of [place] 06/29/2021, 8:50 AM

## 2021-06-29 NOTE — Progress Notes (Signed)
Trauma Event Note  Reason for Call :  Called by Charge RN due to patient having acute onset of chest pain. On my arrival to the unit, the bedside RN was already completing an EKG which showed NSR. Patient verbalized that he felt he may have over worked himself by working with PT, attempting to go to the bathroom and pushing himself out of the chair by himself. During my assessment patient resting comfortably in chair, ice pack to back of left shoulder, pain more manageable at a 6-7 down from a 10. PRN dilaudid was given prior to my arrival, spoke with nurse about giving PRN oxycodone and using dilaudid for breakthrough pain.   Patient confirmed use of IS at bedside and bracing ribs with pillow when moving. Encouraged coughing/deep breathing even though it hurts. Patient requested PRN Benadryl, he takes at home to manage allergies and alpha gal syndrome. MD notified and order placed for Benadryl Q8hr PRN. No other needs at this time.  Park Pope Randel Hargens  Trauma Response RN  Please call TRN at [phone] for further assistance.
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
## 2021-06-30 ENCOUNTER — Inpatient Hospital Stay (HOSPITAL_COMMUNITY): Payer: 59

## 2021-06-30 LAB — BASIC METABOLIC PANEL
Anion gap: 10 (ref 5–15)
BUN: 16 mg/dL (ref 8–23)
CO2: 23 mmol/L (ref 22–32)
Calcium: 8.5 mg/dL — ABNORMAL LOW (ref 8.9–10.3)
Chloride: 99 mmol/L (ref 98–111)
Creatinine, Ser: 0.91 mg/dL (ref 0.61–1.24)
GFR, Estimated: >60 mL/min (ref >60)
Glucose, Bld: 160 mg/dL — ABNORMAL HIGH (ref 70–99)
Potassium: 3.8 mmol/L (ref 3.5–5.1)
Sodium: 132 mmol/L — ABNORMAL LOW (ref 135–145)

## 2021-06-30 LAB — CBC
HCT: 37.1 % — ABNORMAL LOW (ref 39.0–52.0)
Hemoglobin: 12.2 g/dL — ABNORMAL LOW (ref 13.0–17.0)
MCH: 30.6 pg (ref 26.0–34.0)
MCHC: 32.9 g/dL (ref 30.0–36.0)
MCV: 93 fL (ref 80.0–100.0)
Platelets: 189 10*3/uL (ref 150–400)
RBC: 3.99 MIL/uL — ABNORMAL LOW (ref 4.22–5.81)
RDW: 12.2 % (ref 11.5–15.5)
WBC: 9.9 10*3/uL (ref 4.0–10.5)
nRBC: 0 % (ref 0.0–0.2)

## 2021-06-30 MED ORDER — DOCUSATE SODIUM 100 MG PO CAPS
100.0000 mg | ORAL_CAPSULE | Freq: Two times a day (BID) | ORAL | Status: DC
  Administered 2021-06-30 – 2021-07-01 (×4): 100 mg via ORAL
  Filled 2021-06-30 (×4): qty 1

## 2021-06-30 MED ORDER — METHOCARBAMOL 500 MG PO TABS
500.0000 mg | ORAL_TABLET | Freq: Four times a day (QID) | ORAL | Status: DC
  Administered 2021-06-30 – 2021-07-02 (×7): 500 mg via ORAL
  Filled 2021-06-30 (×7): qty 1

## 2021-06-30 MED ORDER — OXYCODONE HCL 5 MG PO TABS
5.0000 mg | ORAL_TABLET | ORAL | Status: DC | PRN
  Administered 2021-06-30: 10 mg via ORAL
  Filled 2021-06-30 (×2): qty 2

## 2021-06-30 MED ORDER — HYDROMORPHONE HCL 1 MG/ML IJ SOLN: 1.0000 mg | INTRAMUSCULAR | Status: DC | PRN

## 2021-06-30 MED ORDER — POLYETHYLENE GLYCOL 3350 17 G PO PACK
17.0000 g | PACK | Freq: Every day | ORAL | Status: DC
  Administered 2021-06-30 – 2021-07-01 (×2): 17 g via ORAL
  Filled 2021-06-30 (×2): qty 1

## 2021-06-30 MED ORDER — BUPIVACAINE HCL (PF) 0.5 % IJ SOLN
10.0000 mL | Freq: Once | INTRAMUSCULAR | Status: DC
  Filled 2021-06-30: qty 10

## 2021-06-30 MED ORDER — LIDOCAINE 5 % EX PTCH
1.0000 | MEDICATED_PATCH | CUTANEOUS | Status: DC
  Administered 2021-06-30 – 2021-07-02 (×3): 1 via TRANSDERMAL
  Filled 2021-06-30 (×3): qty 1

## 2021-06-30 MED ORDER — SODIUM CHLORIDE 1 G PO TABS
1.0000 g | ORAL_TABLET | Freq: Three times a day (TID) | ORAL | Status: DC
  Administered 2021-06-30 – 2021-07-02 (×5): 1 g via ORAL
  Filled 2021-06-30 (×7): qty 1

## 2021-06-30 MED ORDER — METHYLPREDNISOLONE ACETATE 40 MG/ML IJ SUSP
40.0000 mg | Freq: Once | INTRAMUSCULAR | Status: DC
  Filled 2021-06-30: qty 1

## 2021-06-30 MED ORDER — GUAIFENESIN 200 MG PO TABS
200.0000 mg | ORAL_TABLET | Freq: Four times a day (QID) | ORAL | Status: DC
  Administered 2021-06-30 (×2): 200 mg via ORAL
  Filled 2021-06-30 (×6): qty 1

## 2021-06-30 NOTE — Progress Notes (Signed)
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 Upon further assessment, pt reports to RN that he can only eat home grown eggs and has "difficulty breathing and tightness in throat" when eating store bought eggs.   Pt states that he ate boiled eggs for breakfast this morning shortly before his symptoms of dyspnea and desaturation began. I questioned pt why he ate them if he knew that he had a severe allergy. Pt states he "didn't know they were store bought eggs".  Extensive education provided to pt and family at bedside regarding pt's severe allergies and anaphylactic reactions. Pt states he follows an allergist outpt.  Although initially declined, pt took benadryl at this time. Eggs were not previously listed as active allergy on pt's MAR but have been added at this time.  He is currently saturating 90% on 3L [place] at rest and will desat to 85-86% with exhertion.

## 2021-06-30 NOTE — Progress Notes (Signed)
I reviewed his case with multiple orthopedic subspecialists.  As far as the shoulder goes, he can be nonoperative with the use of a sling for about 6 weeks for the scapular body fracture, okay to use fingers wrist and hand, but I doubt he is going to be able to weight-bear with that upper extremity very well.  As far as his knee goes, he has a complicated situation with end-stage arthritic changes, which ultimately is going to need knee replacement.  He also has lateral collateral deficiency, and is going to need some type of surgical reconstruction, although the timing of this can be on a delayed basis.  We may consider allowing his scapula to heal, and then doing either a staged LCL reconstruction followed by total knee replacement, versus combination procedure down the line, I will plan to involve my partners, Dr. Griffin Basil and Dr. Zachery Dakins in this decision making and surgical process.  We will continue to be involved in his acute inpatient care.  Marchia Bond, MD

## 2021-06-30 NOTE — Progress Notes (Addendum)
On Call PA paged d/t increased oxygen needs. Pt increased to 5L [place] d/t desaturation of 82-86% on previous 3L [place]; good waveform noted. Pt remains tachypneic 24 RR.
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
## 2021-06-30 NOTE — Progress Notes (Signed)
Occupational Therapy Evaluation Patient Details Name: Maurice Richards MRN: [id number] DOB: [birth year] Today's Date: 06/30/2021   History of Present Illness pt is a 66 y/o male admitted 5/28 with trauma including left sided rib fractures associated with a smal hemothorax, left scapular fracture and potention R distal femoral condyle fracture, all shown on CT/x-rays.  PMHx:  anxiety, Hep C, rot cuff repairs bil, hernia repair.alpha-gal syndrome (AGS)   Clinical Impression   Pt currently requires [place] 3L for static sitting and drops 02 85% with static standing. Pt reports having alpha gal syndrome and negative swallowing responses to certain foods. Pt reports eating eggs this morning for breakfast and intolerance to store bought eggs at baseline. Rn made aware of potential SLP or dietary education needs. Pt reports when swallowing with production of secretions, coughing and choking with foods even at home. Pt wife present and confirming. Pt reports 3 years of seeing allergy doctor only without food education on his allergy. Recommendation for outpatient at this time pending progression. Pt limited by respiratory needs at this time that pt reports started after pain medications. Rn aware and addressing with MD.       Recommendations for follow up therapy are one component of a multi-disciplinary discharge planning process, led by the attending physician.  Recommendations may be updated based on patient status, additional functional criteria and insurance authorization.   Follow Up Recommendations  Outpatient OT    Assistance Recommended at Discharge Set up Supervision/Assistance  Patient can return home with the following A little help with walking and/or transfers;A little help with bathing/dressing/bathroom    Functional Status Assessment  Patient has had a recent decline in their functional status and demonstrates the ability to make significant improvements in function in a reasonable and  predictable amount of time.  Equipment Recommendations  BSC/3in1    Recommendations for Other Services       Precautions / Restrictions Precautions Precautions: Fall Precaution Comments: ok for AROM through left fingers, wrist and elbow, no AROM at left shoulder Required Braces or Orthoses: Sling;Other Brace Other Brace: Bledsoe brace on when OOB, 0-60 degrees Restrictions Weight Bearing Restrictions: Yes LUE Weight Bearing: Non weight bearing RLE Weight Bearing: Weight bearing as tolerated      Mobility Bed Mobility               General bed mobility comments: oob on arrival. pt reports feels better and breath better    Transfers Overall transfer level: Needs assistance   Transfers: Sit to/from Stand Sit to Stand: Min guard           General transfer comment: cues for L UE placement but able to complete with only R UE use.      Balance Overall balance assessment: Mild deficits observed, not formally tested                                         ADL either performed or assessed with clinical judgement   ADL Overall ADL's : Needs assistance/impaired Eating/Feeding: Set up   Grooming: Applying deodorant   Upper Body Bathing: Set up   Lower Body Bathing: Moderate assistance           Toilet Transfer: Min guard             General ADL Comments: pt noted to require 3L SPO2 on arrival and decreased to 85% with standign  and HR 121. pt with wheezing sound. pt unsteady static standing     Vision   Vision Assessment?: Vision impaired- to be further tested in functional context     Perception     Praxis      Pertinent Vitals/Pain Pain Assessment Pain Assessment: Faces Faces Pain Scale: Hurts a little bit Pain Location: L shoulder Pain Descriptors / Indicators: Sore, Grimacing, Guarding Pain Intervention(s): Monitored during session, Repositioned, Ice applied     Hand Dominance Right   Extremity/Trunk Assessment Upper  Extremity Assessment Upper Extremity Assessment: LUE deficits/detail LUE Deficits / Details: educated on AROM wrist hand digits, avoiding L shoulder flexion per progress notes from ortho.   Lower Extremity Assessment Lower Extremity Assessment: Defer to PT evaluation   Cervical / Trunk Assessment Cervical / Trunk Assessment: Normal   Communication Communication Communication: No difficulties   Cognition Arousal/Alertness: Awake/alert Behavior During Therapy: WFL for tasks assessed/performed Overall Cognitive Status: Within Functional Limits for tasks assessed                                 General Comments: pt reports that 10:30AM hallunicating "i was losing it" but that has passed now. Pt oriented, recalling information about family in room appropriately     General Comments  ice and pain patch on L scapula area.    Exercises Other Exercises Other Exercises: AROM hand wrist digits   Shoulder Instructions      Home Living Family/patient expects to be discharged to:: Private residence Living Arrangements: Spouse/significant other Available Help at Discharge: Family;Available PRN/intermittently;Available 24 hours/day Type of Home: House Home Access: Stairs to enter [employer] of Steps: 4 Entrance Stairs-Rails: None Home Layout: One level     Bathroom Shower/Tub: Walk-in shower;Tub only   Bathroom Toilet: Standard Bathroom Accessibility: Yes   Home Equipment: Cane - single point   Additional Comments: likes 1943 car and motorcycle      Prior Functioning/Environment Prior Level of Function : Independent/Modified Independent;Driving;Other (comment)                        OT Problem List: Decreased activity tolerance;Decreased range of motion;Impaired balance (sitting and/or standing);Decreased knowledge of use of DME or AE;Decreased knowledge of precautions;Impaired UE functional use      OT Treatment/Interventions:  Self-care/ADL training;Therapeutic exercise;Energy conservation;DME and/or AE instruction;Manual therapy;Modalities;Splinting;Therapeutic activities;Patient/family education;Balance training    OT Goals(Current goals can be found in the care plan section) Acute Rehab OT Goals Patient Stated Goal: to d/c home tomorrow OT Goal Formulation: With patient/family Time For Goal Achievement: 07/14/21 Potential to Achieve Goals: Good  OT Frequency: Min 2X/week    Co-evaluation              AM-PAC OT "6 Clicks" Daily Activity     Outcome Measure Help from another person eating meals?: None Help from another person taking care of personal grooming?: None Help from another person toileting, which includes using toliet, bedpan, or urinal?: A Little Help from another person bathing (including washing, rinsing, drying)?: A Little Help from another person to put on and taking off regular upper body clothing?: A Little Help from another person to put on and taking off regular lower body clothing?: A Little 6 Click Score: 20   End of Session Equipment Utilized During Treatment: Oxygen Nurse Communication: Mobility status;Precautions  Activity Tolerance: Patient tolerated treatment well Patient left: in chair;with call bell/phone within  reach;with family/visitor present  OT Visit Diagnosis: Muscle weakness (generalized) (M62.81);Unsteadiness on feet (R26.81)                Time: 4037-0964 OT Time Calculation (min): 48 min Charges:  OT General Charges $OT Visit: 1 Visit OT Evaluation $OT Eval Moderate Complexity: 1 Mod OT Treatments $Self Care/Home Management : 23-37 mins   Brynn, OTR/L  Acute Rehabilitation Services Office: [phone] .   Jeri Modena 06/30/2021, 4:54 PM

## 2021-06-30 NOTE — Progress Notes (Addendum)
Pt resting comfortably in chair. Respirations are even and unlabored. Bilat breath sounds remain unchanged from previous assessment. States he still feels "cold" but dyspnea is improving. Pt is 90% on 4L [place] at this time. Wife present at bedside

## 2021-06-30 NOTE — Progress Notes (Signed)
Orthopedic Tech Progress Note Patient Details:  Maurice Richards 05-21-1955 [id number]  Called [facility] to come out tomorrow to lock [place]EDSO[place] BRACE 0-60 degrees, patient has brace    Patient ID: Maurice Richards, male   DOB: 1955-11-09, 66 y.o.   MRN: [id number]  Maurice Richards 06/30/2021, 6:54 PM
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
## 2021-06-30 NOTE — Progress Notes (Addendum)
Called to room by NT, pt stating he feels lightheaded and "shaky". Upon further assessment, pt's SpO2 found to be 78% on RA with good waveform on the monitor. Pt states he does feel short of breath. Pt placed on 3L [place] at this time; SpO2 92%. PA Brooke notified via secure chat.
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
## 2021-07-01 ENCOUNTER — Inpatient Hospital Stay (HOSPITAL_COMMUNITY): Payer: 59

## 2021-07-01 ENCOUNTER — Ambulatory Visit: Payer: Self-pay | Admitting: Gastroenterology

## 2021-07-01 LAB — BASIC METABOLIC PANEL
Anion gap: 8 (ref 5–15)
BUN: 13 mg/dL (ref 8–23)
CO2: 27 mmol/L (ref 22–32)
Calcium: 8.3 mg/dL — ABNORMAL LOW (ref 8.9–10.3)
Chloride: 97 mmol/L — ABNORMAL LOW (ref 98–111)
Creatinine, Ser: 0.93 mg/dL (ref 0.61–1.24)
GFR, Estimated: >60 mL/min (ref >60)
Glucose, Bld: 122 mg/dL — ABNORMAL HIGH (ref 70–99)
Potassium: 3.7 mmol/L (ref 3.5–5.1)
Sodium: 132 mmol/L — ABNORMAL LOW (ref 135–145)

## 2021-07-01 MED ORDER — OXYCODONE-ACETAMINOPHEN 5-325 MG PO TABS
2.0000 | ORAL_TABLET | ORAL | Status: DC | PRN
  Administered 2021-07-01 – 2021-07-02 (×3): 2 via ORAL
  Filled 2021-07-01 (×3): qty 2

## 2021-07-01 MED ORDER — IPRATROPIUM-ALBUTEROL 0.5-2.5 (3) MG/3ML IN SOLN
3.0000 mL | Freq: Four times a day (QID) | RESPIRATORY_TRACT | Status: AC
  Administered 2021-07-01 (×2): 3 mL via RESPIRATORY_TRACT
  Filled 2021-07-01 (×3): qty 3

## 2021-07-01 MED ORDER — GUAIFENESIN 200 MG PO TABS
400.0000 mg | ORAL_TABLET | Freq: Four times a day (QID) | ORAL | Status: DC
  Administered 2021-07-01 – 2021-07-02 (×5): 400 mg via ORAL
  Filled 2021-07-01 (×7): qty 2
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
## 2021-07-01 NOTE — Progress Notes (Signed)
OT Cancellation Note  Patient Details Name: Maurice Richards MRN: [id number] DOB: 1955-11-17   Cancelled Treatment:    Reason Eval/Treat Not Completed: Other (comment) awaiting bledsoe brace to be dropped off by hanger for functional ambulation per orders, will await brace and f/u for session as time allows.   Maurice Alto., COTA/L Acute Rehabilitation Services [phone]   Maurice Richards 07/01/2021, 8:33 AM

## 2021-07-01 NOTE — Progress Notes (Signed)
Orthopedic Tech Progress Note Patient Details:  Maurice Richards 1955/02/14 [id number]  Yesterday I called upstairs to ask RN TIFFANIE if patient had BLEDSOE brace she stated yes, HANGER this morning went to lock brace in 0-60 as PA BROWN requested, patient does not have brace, so HANGER will be back with brace later this morning.  Patient ID: [name], male   DOB: 1955-06-03, 66 y.o.   MRN: [id number]  Janit Pagan 07/01/2021, 7:48 AM
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
## 2021-07-02 MED ORDER — GUAIFENESIN ER 600 MG PO TB12: 600.0000 mg | ORAL_TABLET | Freq: Two times a day (BID) | ORAL | 2 refills | Status: DC

## 2021-07-02 MED ORDER — POLYETHYLENE GLYCOL 3350 17 G PO PACK: 17.0000 g | PACK | Freq: Every day | ORAL | 0 refills | Status: DC | PRN
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
## 2021-07-02 NOTE — Plan of Care (Signed)
Problem: Education: Goal: Knowledge of General Education information will improve Description: Including pain rating scale, medication(s)/side effects and non-pharmacologic comfort measures 07/02/2021 1319 by Lurline Idol, RN Outcome: Adequate for Discharge 07/02/2021 1319 by Lurline Idol, RN Outcome: Adequate for Discharge   Problem: Health Behavior/Discharge Planning: Goal: Ability to manage health-related needs will improve 07/02/2021 1319 by Lurline Idol, RN Outcome: Adequate for Discharge 07/02/2021 1319 by Lurline Idol, RN Outcome: Adequate for Discharge   Problem: Clinical Measurements: Goal: Ability to maintain clinical measurements within normal limits will improve 07/02/2021 1319 by Lurline Idol, RN Outcome: Adequate for Discharge 07/02/2021 1319 by Lurline Idol, RN Outcome: Adequate for Discharge Goal: Will remain free from infection 07/02/2021 1319 by Lurline Idol, RN Outcome: Adequate for Discharge 07/02/2021 1319 by Lurline Idol, RN Outcome: Adequate for Discharge Goal: Diagnostic test results will improve 07/02/2021 1319 by Lurline Idol, RN Outcome: Adequate for Discharge 07/02/2021 1319 by Lurline Idol, RN Outcome: Adequate for Discharge Goal: Respiratory complications will improve 07/02/2021 1319 by Lurline Idol, RN Outcome: Adequate for Discharge 07/02/2021 1319 by Lurline Idol, RN Outcome: Adequate for Discharge Goal: Cardiovascular complication will be avoided 07/02/2021 1319 by Lurline Idol, RN Outcome: Adequate for Discharge 07/02/2021 1319 by Lurline Idol, RN Outcome: Adequate for Discharge   Problem: Activity: Goal: Risk for activity intolerance will decrease 07/02/2021 1319 by Lurline Idol, RN Outcome: Adequate for Discharge 07/02/2021 1319 by Lurline Idol, RN Outcome: Adequate for Discharge   Problem: Nutrition: Goal: Adequate nutrition will be maintained 07/02/2021 1319 by Lurline Idol, RN Outcome: Adequate for Discharge 07/02/2021 1319  by Lurline Idol, RN Outcome: Adequate for Discharge   Problem: Coping: Goal: Level of anxiety will decrease 07/02/2021 1319 by Lurline Idol, RN Outcome: Adequate for Discharge 07/02/2021 1319 by Lurline Idol, RN Outcome: Adequate for Discharge   Problem: Elimination: Goal: Will not experience complications related to bowel motility 07/02/2021 1319 by Lurline Idol, RN Outcome: Adequate for Discharge 07/02/2021 1319 by Lurline Idol, RN Outcome: Adequate for Discharge Goal: Will not experience complications related to urinary retention 07/02/2021 1319 by Lurline Idol, RN Outcome: Adequate for Discharge 07/02/2021 1319 by Lurline Idol, RN Outcome: Adequate for Discharge   Problem: Pain Managment: Goal: General experience of comfort will improve 07/02/2021 1319 by Lurline Idol, RN Outcome: Adequate for Discharge 07/02/2021 1319 by Lurline Idol, RN Outcome: Adequate for Discharge   Problem: Safety: Goal: Ability to remain free from injury will improve 07/02/2021 1319 by Lurline Idol, RN Outcome: Adequate for Discharge 07/02/2021 1319 by Lurline Idol, RN Outcome: Adequate for Discharge   Problem: Skin Integrity: Goal: Risk for impaired skin integrity will decrease 07/02/2021 1319 by Lurline Idol, RN Outcome: Adequate for Discharge 07/02/2021 1319 by Lurline Idol, RN Outcome: Adequate for Discharge   Problem: Acute Rehab PT Goals(only PT should resolve) Goal: Pt Will Go Supine/Side To Sit Outcome: Adequate for Discharge Goal: Pt Will Go Sit To Supine/Side Outcome: Adequate for Discharge Goal: Patient Will Transfer Sit To/From Stand Outcome: Adequate for Discharge Goal: Pt Will Transfer Bed To Chair/Chair To Bed Outcome: Adequate for Discharge Goal: Pt Will Ambulate Outcome: Adequate for Discharge Goal: Pt Will Go Up/Down Stairs Outcome: Adequate for Discharge   Problem: Acute Rehab OT Goals (only OT should resolve) Goal: Pt. Will Perform Upper Body  Bathing Outcome: Adequate for Discharge Goal: Pt. Will Perform Lower Body Bathing  Outcome: Adequate for Discharge Goal: Pt. Will Transfer To Toilet Outcome: Adequate for Discharge Goal: OT Additional ADL Goal #1 Outcome: Adequate for Discharge

## 2021-07-02 NOTE — Progress Notes (Signed)
Patient given discharge instructions and stated understanding. 
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
## 2021-07-10 DIAGNOSIS — S83421A Sprain of lateral collateral ligament of right knee, initial encounter: Secondary | ICD-10-CM | POA: Diagnosis not present

## 2021-07-14 ENCOUNTER — Other Ambulatory Visit: Payer: Self-pay

## 2021-07-14 ENCOUNTER — Encounter (HOSPITAL_BASED_OUTPATIENT_CLINIC_OR_DEPARTMENT_OTHER): Payer: Self-pay | Admitting: Orthopaedic Surgery

## 2021-07-16 ENCOUNTER — Telehealth: Payer: Self-pay | Admitting: Allergy & Immunology

## 2021-07-16 MED ORDER — MONTELUKAST SODIUM 10 MG PO TABS: 10.0000 mg | ORAL_TABLET | Freq: Every day | ORAL | 5 refills | Status: DC
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
## 2021-07-16 NOTE — Telephone Encounter (Signed)
That is fine. I sent it in. Can we make a televisit with one of the NPs sometime instead?   Salvatore Marvel, MD [facility]

## 2021-07-16 NOTE — Telephone Encounter (Signed)
Will forward to [employer] to schedule Televisit.

## 2021-07-22 ENCOUNTER — Other Ambulatory Visit: Payer: Self-pay

## 2021-07-22 ENCOUNTER — Encounter (HOSPITAL_BASED_OUTPATIENT_CLINIC_OR_DEPARTMENT_OTHER): Payer: Self-pay | Admitting: Orthopaedic Surgery

## 2021-07-24 ENCOUNTER — Ambulatory Visit: Payer: 59
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
## 2021-07-27 NOTE — Discharge Instructions (Addendum)
Ophelia Charter MD, MPH Noemi Chapel, PA-C [facility] [address] [phone] (tel)   [phone] (fax)   POST-OPERATIVE INSTRUCTIONS - Meniscus Repair  WOUND CARE - You may remove the Operative Dressing on Post-Op Day #3 (72hrs after surgery).   - Alternatively if you would like you can leave dressing on until follow-up if within 7-8 days but keep it dry. - Leave steri-strips in place until they fall off on their own, usually 2 weeks postop. - An ACE wrap may be used to control swelling, do not wrap this too tight.  If the initial ACE wrap feels too tight you may loosen it. - There may be a small amount of fluid/bleeding leaking at the surgical site.  - This is normal; the knee is filled with fluid during the procedure and can leak for 24-48hrs after surgery.  - You may change/reinforce the bandage as needed.  - Use the Cryocuff or Ice as often as possible for the first 7 days, then as needed for pain relief. Always keep a towel, ACE wrap or other barrier between the cooling unit and your skin.  - You may shower on Post-Op Day #3. Gently pat the area dry.  - Do not soak the knee in water or submerge it.  - Do not go swimming in the pool or ocean until 4 weeks after surgery or when otherwise instructed.  Keep incisions as dry as possible.   BRACE/AMBULATION - You will be placed in a brace post-operatively.  - Wear your brace at all times until follow-up.  - You may remove for hygiene. -           Use crutches to help you ambulate -           Touch-down weight bearing: when you stand or walk, you may only touch your foot to the floor for balance -           Do NOT put any body weight on your leg  PHYSICAL THERAPY - You will begin physical therapy soon after surgery (unless otherwise specified) - Please call to set up an appointment, if you do not already have one  - Let our office if there are any issues with scheduling your therapy  - You have a  physical therapy appointment scheduled at [facility] (across the hall from our office) on Monday, July 3rd at 10 am  REGIONAL ANESTHESIA ([place]) - The anesthesia team may have performed a nerve block for you if safe in the setting of your care.  This is a great tool used to minimize pain.  Typically the block may start wearing off overnight.  This can be a challenging period but please utilize your as needed pain medications to try and manage this period and know it will be a brief transition as the nerve block wears completely   POST-OP MEDICATIONS - Multimodal approach to pain control - In general your pain will be controlled with a combination of substances.  Prescriptions unless otherwise discussed are electronically sent to your pharmacy.  This is a carefully made plan we use to minimize narcotic use.     - Celebrex - Anti-inflammatory medication taken on a scheduled basis - Acetaminophen - Non-narcotic pain medicine taken on a scheduled basis  - Gabapentin - this is a medication to help with nerve based pain, take on a scheduled basis - Oxycodone - This is a strong narcotic, to be used only on an "as  needed" basis for SEVERE pain. - Aspirin '81mg'$  - This medicine is used to minimize the risk of blood clots after surgery. - Zofran - take as needed for nausea  FOLLOW-UP   Please call the office to schedule a follow-up appointment for your incision check, 7-10 days post-operatively.  IF YOU HAVE ANY QUESTIONS, PLEASE FEEL FREE TO CALL OUR OFFICE.   HELPFUL INFORMATION  - If you had a block, it will wear off between 8-24 hrs postop typically.  This is period when your pain may go from nearly zero to the pain you would have had post-op without the block.  This is an abrupt transition but nothing dangerous is happening.  You may take an extra dose of narcotic when this happens.   Keep your leg elevated to decrease swelling, which will then in turn decrease your pain. I would elevate the  foot of your bed by putting a couple of couch pillows between your mattress and box spring. I would not keep pillow directly under your ankle.  - Do not sleep with a pillow behind your knee even if it is more comfortable as this may make it harder to get your knee fully straight long term.   There will be MORE swelling on days 1-3 than there is on the day of surgery.  This also is normal. The swelling will decrease with the anti-inflammatory medication, ice and keeping it elevated. The swelling will make it more difficult to bend your knee. As the swelling goes down your motion will become easier   You may develop swelling and bruising that extends from your knee down to your calf and perhaps even to your foot over the next week. Do not be alarmed. This too is normal, and it is due to gravity   There may be some numbness adjacent to the incision site. This may last for 6-12 months or longer in some patients and is expected.   You may return to sedentary work/school in the next couple of days when you feel up to it. You will need to keep your leg elevated as much as possible    You should wean off your narcotic medicines as soon as you are able.  Most patients will be off or using minimal narcotics before their first postop appointment.    We suggest you use the pain medication the first night prior to going to bed, in order to ease any pain when the anesthesia wears off. You should avoid taking pain medications on an empty stomach as it will make you nauseous.   Do not drink alcoholic beverages or take illicit drugs when taking pain medications.   It is against the law to drive while taking narcotics. You cannot drive if your Right leg is in brace locked in extension.   Pain medication may make you constipated.  Below are a few solutions to try in this order:  o Decrease the amount of pain medication if you aren't having pain.  o Drink lots of decaffeinated fluids.  o Drink prune  juice and/or each dried prunes   o If the first 3 don't work start with additional solutions  o Take Colace - an over-the-counter stool softener  o Take Senokot - an over-the-counter laxative  o Take Miralax - a stronger over-the-counter laxative   For more information including helpful videos and documents visit our website:   [email or web address]    May take Tylenol after 3:20pm, if needed   [place]  Instructions  Activity: Get plenty of rest for the remainder of the day. A responsible individual must stay with you for 24 hours following the procedure.  For the next 24 hours, DO NOT: -Drive a car -Paediatric nurse -Drink alcoholic beverages -Take any medication unless instructed by your physician -Make any legal decisions or sign important papers.  Meals: Start with liquid foods such as gelatin or soup. Progress to regular foods as tolerated. Avoid greasy, spicy, heavy foods. If nausea and/or vomiting occur, drink only clear liquids until the nausea and/or vomiting subsides. Call your physician if vomiting continues.  Special Instructions/Symptoms: Your throat may feel dry or sore from the anesthesia or the breathing tube placed in your throat during surgery. If this causes discomfort, gargle with warm salt water. The discomfort should disappear within 24 hours.  If you had a scopolamine patch placed behind your ear for the management of post- operative nausea and/or vomiting:  1. The medication in the patch is effective for 72 hours, after which it should be removed.  Wrap patch in a tissue and discard in the trash. Wash hands thoroughly with soap and water. 2. You may remove the patch earlier than 72 hours if you experience unpleasant side effects which may include dry mouth, dizziness or visual disturbances. 3. Avoid touching the patch. Wash your hands with soap and water after contact with the patch.

## 2021-07-30 ENCOUNTER — Other Ambulatory Visit: Payer: Self-pay

## 2021-07-30 ENCOUNTER — Ambulatory Visit (HOSPITAL_BASED_OUTPATIENT_CLINIC_OR_DEPARTMENT_OTHER): Payer: 59 | Admitting: Certified Registered"

## 2021-07-30 ENCOUNTER — Ambulatory Visit (HOSPITAL_BASED_OUTPATIENT_CLINIC_OR_DEPARTMENT_OTHER)
Admission: RE | Admit: 2021-07-30 | Discharge: 2021-07-30 | Disposition: A | Discharge status: Home / Self Care | Payer: 59 | Attending: Orthopaedic Surgery | Admitting: Orthopaedic Surgery

## 2021-07-30 ENCOUNTER — Encounter (HOSPITAL_BASED_OUTPATIENT_CLINIC_OR_DEPARTMENT_OTHER): Payer: Self-pay | Admitting: Orthopaedic Surgery

## 2021-07-30 ENCOUNTER — Encounter (HOSPITAL_BASED_OUTPATIENT_CLINIC_OR_DEPARTMENT_OTHER)
Admission: RE | Disposition: A | Discharge status: Home / Self Care | Payer: Self-pay | Source: Home / Self Care | Attending: Orthopaedic Surgery

## 2021-07-30 DIAGNOSIS — M2341 Loose body in knee, right knee: Secondary | ICD-10-CM | POA: Diagnosis not present

## 2021-07-30 DIAGNOSIS — M1711 Unilateral primary osteoarthritis, right knee: Secondary | ICD-10-CM | POA: Insufficient documentation

## 2021-07-30 DIAGNOSIS — K759 Inflammatory liver disease, unspecified: Secondary | ICD-10-CM | POA: Insufficient documentation

## 2021-07-30 DIAGNOSIS — S83281A Other tear of lateral meniscus, current injury, right knee, initial encounter: Secondary | ICD-10-CM | POA: Insufficient documentation

## 2021-07-30 DIAGNOSIS — S83241A Other tear of medial meniscus, current injury, right knee, initial encounter: Secondary | ICD-10-CM | POA: Diagnosis not present

## 2021-07-30 DIAGNOSIS — J45909 Unspecified asthma, uncomplicated: Secondary | ICD-10-CM | POA: Diagnosis not present

## 2021-07-30 DIAGNOSIS — S8981XA Other specified injuries of right lower leg, initial encounter: Secondary | ICD-10-CM | POA: Diagnosis not present

## 2021-07-30 DIAGNOSIS — Z87891 Personal history of nicotine dependence: Secondary | ICD-10-CM | POA: Insufficient documentation

## 2021-07-30 DIAGNOSIS — K219 Gastro-esophageal reflux disease without esophagitis: Secondary | ICD-10-CM | POA: Diagnosis not present

## 2021-07-30 DIAGNOSIS — S83231A Complex tear of medial meniscus, current injury, right knee, initial encounter: Secondary | ICD-10-CM | POA: Diagnosis not present

## 2021-07-30 DIAGNOSIS — M2241 Chondromalacia patellae, right knee: Secondary | ICD-10-CM | POA: Diagnosis not present

## 2021-07-30 DIAGNOSIS — S2242XA Multiple fractures of ribs, left side, initial encounter for closed fracture: Secondary | ICD-10-CM | POA: Insufficient documentation

## 2021-07-30 HISTORY — PX: CHONDROPLASTY: SHX5177

## 2021-07-30 HISTORY — PX: KNEE ARTHROSCOPY: SHX127

## 2021-07-30 SURGERY — REPAIR, KNEE, POSTEROLATERAL CORNER
Anesthesia: General | Site: Knee | Laterality: Right

## 2021-07-30 MED ORDER — FENTANYL CITRATE (PF) 100 MCG/2ML IJ SOLN
INTRAMUSCULAR | Status: AC
  Filled 2021-07-30: qty 2

## 2021-07-30 MED ORDER — GABAPENTIN 300 MG PO CAPS
ORAL_CAPSULE | ORAL | Status: AC
Start: 2021-07-30 — End: ?
  Filled 2021-07-30: qty 1

## 2021-07-30 MED ORDER — OXYCODONE HCL 5 MG PO TABS: 5.0000 mg | ORAL_TABLET | Freq: Once | ORAL | Status: DC | PRN

## 2021-07-30 MED ORDER — GABAPENTIN 300 MG PO CAPS
300.0000 mg | ORAL_CAPSULE | Freq: Once | ORAL | Status: AC
  Administered 2021-07-30: 300 mg via ORAL

## 2021-07-30 MED ORDER — VANCOMYCIN HCL 1000 MG IV SOLR
INTRAVENOUS | Status: DC | PRN
  Administered 2021-07-30: 1000 mg via TOPICAL

## 2021-07-30 MED ORDER — CEFAZOLIN SODIUM-DEXTROSE 2-4 GM/100ML-% IV SOLN
INTRAVENOUS | Status: AC
  Filled 2021-07-30: qty 100

## 2021-07-30 MED ORDER — LACTATED RINGERS IV SOLN: INTRAVENOUS | Status: DC

## 2021-07-30 MED ORDER — ONDANSETRON HCL 4 MG PO TABS: 4.0000 mg | ORAL_TABLET | Freq: Three times a day (TID) | ORAL | 0 refills | Status: AC | PRN

## 2021-07-30 MED ORDER — BUPIVACAINE HCL (PF) 0.25 % IJ SOLN
INTRAMUSCULAR | Status: AC
Start: 2021-07-30 — End: ?
  Filled 2021-07-30: qty 30

## 2021-07-30 MED ORDER — ONDANSETRON HCL 4 MG/2ML IJ SOLN: 4.0000 mg | Freq: Once | INTRAMUSCULAR | Status: DC | PRN

## 2021-07-30 MED ORDER — PROPOFOL 10 MG/ML IV BOLUS
INTRAVENOUS | Status: DC | PRN
  Administered 2021-07-30: 200 mg via INTRAVENOUS

## 2021-07-30 MED ORDER — LIDOCAINE HCL (CARDIAC) PF 100 MG/5ML IV SOSY
PREFILLED_SYRINGE | INTRAVENOUS | Status: DC | PRN
  Administered 2021-07-30: 60 mg via INTRAVENOUS

## 2021-07-30 MED ORDER — OXYCODONE HCL 5 MG PO TABS: ORAL_TABLET | ORAL | 0 refills | Status: AC

## 2021-07-30 MED ORDER — BUPIVACAINE HCL (PF) 0.25 % IJ SOLN
INTRAMUSCULAR | Status: DC | PRN
  Administered 2021-07-30: 30 mL

## 2021-07-30 MED ORDER — MIDAZOLAM HCL 2 MG/2ML IJ SOLN
INTRAMUSCULAR | Status: AC
  Filled 2021-07-30: qty 2

## 2021-07-30 MED ORDER — ACETAMINOPHEN 500 MG PO TABS
1000.0000 mg | ORAL_TABLET | Freq: Once | ORAL | Status: AC
  Administered 2021-07-30: 1000 mg via ORAL

## 2021-07-30 MED ORDER — ACETAMINOPHEN 500 MG PO TABS
1000.0000 mg | ORAL_TABLET | Freq: Three times a day (TID) | ORAL | 0 refills | Status: AC
Start: 2021-07-30 — End: 2021-08-13

## 2021-07-30 MED ORDER — GABAPENTIN 100 MG PO CAPS: 100.0000 mg | ORAL_CAPSULE | Freq: Three times a day (TID) | ORAL | 0 refills | Status: AC

## 2021-07-30 MED ORDER — OXYCODONE HCL 5 MG/5ML PO SOLN: 5.0000 mg | Freq: Once | ORAL | Status: DC | PRN

## 2021-07-30 MED ORDER — CEFAZOLIN SODIUM-DEXTROSE 2-4 GM/100ML-% IV SOLN
2.0000 g | INTRAVENOUS | Status: AC
  Administered 2021-07-30: 2 g via INTRAVENOUS

## 2021-07-30 MED ORDER — MIDAZOLAM HCL 5 MG/5ML IJ SOLN
INTRAMUSCULAR | Status: DC | PRN
  Administered 2021-07-30: 2 mg via INTRAVENOUS

## 2021-07-30 MED ORDER — ACETAMINOPHEN 500 MG PO TABS
ORAL_TABLET | ORAL | Status: AC
  Filled 2021-07-30: qty 2

## 2021-07-30 MED ORDER — FENTANYL CITRATE (PF) 100 MCG/2ML IJ SOLN
INTRAMUSCULAR | Status: DC | PRN
  Administered 2021-07-30: 25 ug via INTRAVENOUS
  Administered 2021-07-30: 100 ug via INTRAVENOUS
  Administered 2021-07-30: 25 ug via INTRAVENOUS
  Administered 2021-07-30: 50 ug via INTRAVENOUS

## 2021-07-30 MED ORDER — CELECOXIB 100 MG PO CAPS: 100.0000 mg | ORAL_CAPSULE | Freq: Two times a day (BID) | ORAL | 0 refills | Status: AC

## 2021-07-30 MED ORDER — FENTANYL CITRATE (PF) 100 MCG/2ML IJ SOLN
25.0000 ug | INTRAMUSCULAR | Status: DC | PRN
  Administered 2021-07-30: 25 ug via INTRAVENOUS

## 2021-07-30 MED ORDER — SODIUM CHLORIDE 0.9 % IR SOLN
Status: DC | PRN
  Administered 2021-07-30: 1000 mL

## 2021-07-30 MED ORDER — ONDANSETRON HCL 4 MG/2ML IJ SOLN
INTRAMUSCULAR | Status: DC | PRN
  Administered 2021-07-30: 4 mg via INTRAVENOUS

## 2021-07-30 MED ORDER — DEXAMETHASONE SODIUM PHOSPHATE 4 MG/ML IJ SOLN
INTRAMUSCULAR | Status: DC | PRN
  Administered 2021-07-30: 5 mg via INTRAVENOUS

## 2021-07-30 MED ORDER — ASPIRIN 81 MG PO CHEW: 81.0000 mg | CHEWABLE_TABLET | Freq: Two times a day (BID) | ORAL | 0 refills | Status: AC

## 2021-07-30 MED ORDER — SUCCINYLCHOLINE CHLORIDE 200 MG/10ML IV SOSY
PREFILLED_SYRINGE | INTRAVENOUS | Status: DC | PRN
  Administered 2021-07-30: 120 mg via INTRAVENOUS

## 2021-07-30 SURGICAL SUPPLY — 87 items
ANCH SUT 5 FBRTK 2.6 KNTLS SLF (Anchor) ×1 IMPLANT
ANCH SUT SWLK 19.1X6.25 CLS (Anchor) ×1 IMPLANT
ANCHOR SUT FBRTK 2.6 SP #5 (Anchor) ×1 IMPLANT
ANCHOR SUT SWIVELLOK BIO (Anchor) ×1 IMPLANT
APL PRP STRL LF DISP 70% ISPRP (MISCELLANEOUS) ×1
BLADE AVERAGE 25X9 (BLADE) ×1 IMPLANT
BLADE SHAVER BONE 5.0X13 (MISCELLANEOUS) ×2 IMPLANT
BLADE SURG 10 STRL SS (BLADE) ×2 IMPLANT
BLADE SURG 15 STRL LF DISP TIS (BLADE) ×1 IMPLANT
BLADE SURG 15 STRL SS (BLADE) ×2
BNDG COHESIVE 4X5 TAN ST LF (GAUZE/BANDAGES/DRESSINGS) IMPLANT
BNDG ELASTIC 6X5.8 VLCR STR LF (GAUZE/BANDAGES/DRESSINGS) ×2 IMPLANT
BONE TUNNEL PLUG CANNULATED (MISCELLANEOUS) IMPLANT
BURR OVAL 8 FLU 4.0X13 (MISCELLANEOUS) IMPLANT
CHLORAPREP W/TINT 26 (MISCELLANEOUS) ×2 IMPLANT
CLSR STERI-STRIP ANTIMIC 1/2X4 (GAUZE/BANDAGES/DRESSINGS) ×2 IMPLANT
COOLER ICEMAN CLASSIC (MISCELLANEOUS) ×2 IMPLANT
COVER BACK TABLE 60X90IN (DRAPES) ×2 IMPLANT
CUFF TOURN SGL QUICK 34 (TOURNIQUET CUFF) ×2
CUFF TRNQT CYL 34X4.125X (TOURNIQUET CUFF) IMPLANT
CUTTER TENSIONER SUT 2-0 0 FBW (INSTRUMENTS) IMPLANT
DISSECTOR 3.5MM X 13CM CVD (MISCELLANEOUS) ×2 IMPLANT
DISSECTOR 4.0MMX13CM CVD (MISCELLANEOUS) IMPLANT
DRAPE ARTHROSCOPY W/POUCH 90 (DRAPES) ×2 IMPLANT
DRAPE IMP U-DRAPE 54X76 (DRAPES) ×2 IMPLANT
DRAPE OEC MINIVIEW 54X84 (DRAPES) ×2 IMPLANT
DRAPE TOP ARMCOVERS (MISCELLANEOUS) ×1 IMPLANT
DRAPE U-SHAPE 47X51 STRL (DRAPES) ×2 IMPLANT
DW OUTFLOW CASSETTE/TUBE SET (MISCELLANEOUS) ×2 IMPLANT
FIBERSTICK 2 (SUTURE) IMPLANT
GAUZE SPONGE 4X4 12PLY STRL (GAUZE/BANDAGES/DRESSINGS) ×4 IMPLANT
GLOVE BIO SURGEON STRL SZ 6.5 (GLOVE) ×4 IMPLANT
GLOVE BIOGEL PI IND STRL 6.5 (GLOVE) ×1 IMPLANT
GLOVE BIOGEL PI IND STRL 8 (GLOVE) ×1 IMPLANT
GLOVE BIOGEL PI INDICATOR 6.5 (GLOVE) ×2
GLOVE BIOGEL PI INDICATOR 8 (GLOVE) ×1
GLOVE ECLIPSE 8.0 STRL XLNG CF (GLOVE) ×3 IMPLANT
GLOVE SURG SS PI 6.5 STRL IVOR (GLOVE) ×2 IMPLANT
GOWN STRL REUS W/ TWL LRG LVL3 (GOWN DISPOSABLE) ×2 IMPLANT
GOWN STRL REUS W/ TWL XL LVL3 (GOWN DISPOSABLE) ×1 IMPLANT
GOWN STRL REUS W/TWL LRG LVL3 (GOWN DISPOSABLE) ×4
GOWN STRL REUS W/TWL XL LVL3 (GOWN DISPOSABLE) ×2
GRAFT TISS ANT TIB TNDN (Tissue) IMPLANT
GUIDEPIN FLEX PATHFINDER 2.4MM (WIRE) ×1 IMPLANT
IMMOBILIZER KNEE 20 (SOFTGOODS)
IMMOBILIZER KNEE 20 THIGH 36 (SOFTGOODS) IMPLANT
IMMOBILIZER KNEE 22 UNIV (SOFTGOODS) ×1 IMPLANT
IMPL SCREW BIO 8X30 (Screw) IMPLANT
IMPLANT SCREW BIO 8X30 (Screw) ×2 IMPLANT
IV NS IRRIG 3000ML ARTHROMATIC (IV SOLUTION) ×5 IMPLANT
KIT LEG STABILIZATION (KITS) IMPLANT
KIT TRANSTIBIAL (DISPOSABLE) ×3 IMPLANT
LOOP VESSEL MAXI BLUE (MISCELLANEOUS) ×1 IMPLANT
NDL SAFETY ECLIPSE 18X1.5 (NEEDLE) ×1 IMPLANT
NEEDLE HYPO 18GX1.5 SHARP (NEEDLE) ×2
NS IRRIG 1000ML POUR BTL (IV SOLUTION) ×2 IMPLANT
PACK ARTHROSCOPY DSU (CUSTOM PROCEDURE TRAY) ×2 IMPLANT
PACK BASIN DAY SURGERY FS (CUSTOM PROCEDURE TRAY) IMPLANT
PAD CAST 4YDX4 CTTN HI CHSV (CAST SUPPLIES) ×1 IMPLANT
PAD COLD SHLDR WRAP-ON (PAD) ×2 IMPLANT
PADDING CAST COTTON 4X4 STRL (CAST SUPPLIES) ×2
PENCIL SMOKE EVACUATOR (MISCELLANEOUS) ×2 IMPLANT
PORT APPOLLO RF 90DEGREE MULTI (SURGICAL WAND) ×1 IMPLANT
SHEET MEDIUM DRAPE 40X70 STRL (DRAPES) ×2 IMPLANT
SLEEVE SCD COMPRESS KNEE MED (STOCKING) ×2 IMPLANT
SPIKE FLUID TRANSFER (MISCELLANEOUS) IMPLANT
SPONGE T-LAP 18X18 ~~LOC~~+RFID (SPONGE) ×2 IMPLANT
SPONGE T-LAP 4X18 ~~LOC~~+RFID (SPONGE) ×2 IMPLANT
SUT 0 FIBERLOOP 38 BLUE TPR ND (SUTURE)
SUT FIBERWIRE #2 38 T-5 BLUE (SUTURE) ×8
SUT MNCRL AB 4-0 PS2 18 (SUTURE) ×2 IMPLANT
SUT VIC AB 0 CT1 27 (SUTURE)
SUT VIC AB 0 CT1 27XBRD ANBCTR (SUTURE) IMPLANT
SUT VIC AB 1 CT1 27 (SUTURE) ×6
SUT VIC AB 1 CT1 27XBRD ANBCTR (SUTURE) IMPLANT
SUT VIC AB 2-0 SH 27 (SUTURE) ×6
SUT VIC AB 2-0 SH 27XBRD (SUTURE) ×1 IMPLANT
SUTURE 0 FIBERLP 38 BLU TPR ND (SUTURE) IMPLANT
SUTURE FIBERWR #2 38 T-5 BLUE (SUTURE) IMPLANT
SUTURE TAPE 1.3 FIBERLOP 20 ST (SUTURE) ×2 IMPLANT
SUTURETAPE 1.3 FIBERLOOP 20 ST (SUTURE) ×4
SYR 5ML LL (SYRINGE) ×2 IMPLANT
TENDON ANTERIOR TIBIALIS (Tissue) ×2 IMPLANT
TOWEL GREEN STERILE FF (TOWEL DISPOSABLE) ×2 IMPLANT
TUBE CONNECTING 20X1/4 (TUBING) ×2 IMPLANT
TUBE SUCTION HIGH CAP CLEAR NV (SUCTIONS) ×2 IMPLANT
TUBING ARTHROSCOPY IRRIG 16FT (MISCELLANEOUS) ×2 IMPLANT
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
## 2021-07-30 NOTE — Anesthesia Procedure Notes (Signed)
Procedure Name: Intubation Date/Time: 07/30/2021 10:42 AM  Performed by: Bufford Spikes, CRNAPre-anesthesia Checklist: Patient identified, Emergency Drugs available, Suction available and Patient being monitored Patient Re-evaluated:Patient Re-evaluated prior to induction Oxygen Delivery Method: Circle system utilized Preoxygenation: Pre-oxygenation with 100% oxygen Induction Type: IV induction, Rapid sequence and Cricoid Pressure applied Ventilation: Mask ventilation without difficulty Laryngoscope Size: Miller and 2 Grade View: Grade II Tube type: Oral Number of attempts: 1 Airway Equipment and Method: Stylet and Oral airway Placement Confirmation: ETT inserted through vocal cords under direct vision, positive ETCO2 and breath sounds checked- equal and bilateral Secured at: 21 cm Tube secured with: Tape Dental Injury: Teeth and Oropharynx as per pre-operative assessment

## 2021-07-30 NOTE — Interval H&P Note (Signed)
All questions answered, patient wants to proceed with procedure. ? ?

## 2021-07-30 NOTE — Anesthesia Preprocedure Evaluation (Addendum)
Anesthesia Evaluation  Patient identified by MRN, date of birth, ID band Patient awake    Reviewed: Allergy & Precautions, NPO status , Patient's Chart, lab work & pertinent test results  History of Anesthesia Complications Negative for: history of anesthetic complications  Airway Mallampati: II  TM Distance: >3 FB Neck ROM: Full    Dental  (+) Dental Advisory Given, Chipped, Caps   Pulmonary asthma , former smoker,    Pulmonary exam normal        Cardiovascular negative cardio ROS Normal cardiovascular exam     Neuro/Psych  Headaches, PSYCHIATRIC DISORDERS Anxiety    GI/Hepatic hiatal hernia, GERD  Medicated and Controlled,(+) Hepatitis -, C  Endo/Other  negative endocrine ROS  Renal/GU negative Renal ROS     Musculoskeletal negative musculoskeletal ROS (+) narcotic dependent  Abdominal   Peds  Hematology negative hematology ROS (+)   Anesthesia Other Findings   Reproductive/Obstetrics                           Anesthesia Physical Anesthesia Plan  ASA: 2  Anesthesia Plan: General   Post-op Pain Management: Tylenol PO (pre-op)*   Induction: Intravenous  PONV Risk Score and Plan: 2 and Treatment may vary due to age or medical condition, Ondansetron, Dexamethasone and Midazolam  Airway Management Planned: LMA  Additional Equipment: None  Intra-op Plan:   Post-operative Plan: Extubation in OR  Informed Consent: I have reviewed the patients History and Physical, chart, labs and discussed the procedure including the risks, benefits and alternatives for the proposed anesthesia with the patient or authorized representative who has indicated his/her understanding and acceptance.     Dental advisory given  Plan Discussed with: CRNA and Anesthesiologist  Anesthesia Plan Comments:       Anesthesia Quick Evaluation

## 2021-07-30 NOTE — Anesthesia Postprocedure Evaluation (Signed)
Anesthesia Post Note  Patient: Maurice Richards  Procedure(s) Performed: POSTERIOR LATERAL CORNER REPAIR HAMSTRING REPAIR (Right: Knee) CHONDROPLASTY (Right: Knee) ARTHROSCOPY KNEE WITH REMOVAL OF LOOSE BODY PARTIAL MEDIAL MENISECTOMY  (Right: Knee)     Patient location during evaluation: PACU Anesthesia Type: General Level of consciousness: awake and alert Pain management: pain level controlled Vital Signs Assessment: post-procedure vital signs reviewed and stable Respiratory status: spontaneous breathing, nonlabored ventilation and respiratory function stable Cardiovascular status: stable and blood pressure returned to baseline Anesthetic complications: no   No notable events documented.  Last Vitals:  Vitals:   07/30/21 1311 07/30/21 1321  BP: (!) 125/96 (!) 141/84  Pulse: 73 79  Resp: 19 16  Temp:  36.5 C  SpO2: 94% 94%    Last Pain:  Vitals:   07/30/21 1321  TempSrc:   PainSc: 3         RLE Motor Response: Purposeful movement;Responds to commands (07/30/21 1321) RLE Sensation: Full sensation (07/30/21 1321)      Audry Pili

## 2021-07-30 NOTE — Transfer of Care (Signed)
Immediate Anesthesia Transfer of Care Note  Patient: Maurice Richards  Procedure(s) Performed: POSTERIOR LATERAL CORNER REPAIR HAMSTRING REPAIR (Right: Knee) CHONDROPLASTY (Right: Knee) ARTHROSCOPY KNEE WITH REMOVAL OF LOOSE BODY PARTIAL MEDIAL MENISECTOMY  (Right: Knee)  Patient Location: PACU  Anesthesia Type:General  Level of Consciousness: awake, alert  and oriented  Airway & Oxygen Therapy: Patient Spontanous Breathing and Patient connected to nasal cannula oxygen  Post-op Assessment: Report given to RN and Post -op Vital signs reviewed and stable  Post vital signs: Reviewed and stable  Last Vitals:  Vitals Value Taken Time  BP 147/95 07/30/21 1235  Temp    Pulse 74 07/30/21 1239  Resp 13 07/30/21 1239  SpO2 98 % 07/30/21 1239  Vitals shown include unvalidated device data.  Last Pain:  Vitals:   07/30/21 0911  TempSrc: Oral  PainSc: 0-No pain      Patients Stated Pain Goal: 4 (84/13/24 4010)  Complications: No notable events documented.

## 2021-07-31 ENCOUNTER — Encounter (HOSPITAL_BASED_OUTPATIENT_CLINIC_OR_DEPARTMENT_OTHER): Payer: Self-pay | Admitting: Orthopaedic Surgery

## 2021-07-31 ENCOUNTER — Ambulatory Visit: Payer: 59

## 2021-08-03 DIAGNOSIS — M2351 Chronic instability of knee, right knee: Secondary | ICD-10-CM | POA: Diagnosis not present

## 2021-08-03 DIAGNOSIS — M2241 Chondromalacia patellae, right knee: Secondary | ICD-10-CM | POA: Diagnosis not present

## 2021-08-06 DIAGNOSIS — M2241 Chondromalacia patellae, right knee: Secondary | ICD-10-CM | POA: Diagnosis not present

## 2021-08-06 DIAGNOSIS — M2351 Chronic instability of knee, right knee: Secondary | ICD-10-CM | POA: Diagnosis not present

## 2021-08-10 DIAGNOSIS — M2241 Chondromalacia patellae, right knee: Secondary | ICD-10-CM | POA: Diagnosis not present

## 2021-08-10 DIAGNOSIS — M2351 Chronic instability of knee, right knee: Secondary | ICD-10-CM | POA: Diagnosis not present

## 2021-08-11 NOTE — Telephone Encounter (Signed)
Maurice Richards has been placed in the [employer] to transfer to [employer].

## 2021-08-17 DIAGNOSIS — M2351 Chronic instability of knee, right knee: Secondary | ICD-10-CM | POA: Diagnosis not present

## 2021-08-17 DIAGNOSIS — M2241 Chondromalacia patellae, right knee: Secondary | ICD-10-CM | POA: Diagnosis not present

## 2021-08-19 ENCOUNTER — Other Ambulatory Visit: Payer: Self-pay | Admitting: Allergy & Immunology

## 2021-08-20 DIAGNOSIS — M2241 Chondromalacia patellae, right knee: Secondary | ICD-10-CM | POA: Diagnosis not present

## 2021-08-20 DIAGNOSIS — M2351 Chronic instability of knee, right knee: Secondary | ICD-10-CM | POA: Diagnosis not present

## 2021-08-21 ENCOUNTER — Ambulatory Visit (INDEPENDENT_AMBULATORY_CARE_PROVIDER_SITE_OTHER): Payer: 59

## 2021-08-21 DIAGNOSIS — J4550 Severe persistent asthma, uncomplicated: Secondary | ICD-10-CM

## 2021-08-31 DIAGNOSIS — M2351 Chronic instability of knee, right knee: Secondary | ICD-10-CM | POA: Diagnosis not present

## 2021-08-31 DIAGNOSIS — M2241 Chondromalacia patellae, right knee: Secondary | ICD-10-CM | POA: Diagnosis not present

## 2021-09-10 DIAGNOSIS — M2351 Chronic instability of knee, right knee: Secondary | ICD-10-CM | POA: Diagnosis not present

## 2021-09-10 DIAGNOSIS — M2241 Chondromalacia patellae, right knee: Secondary | ICD-10-CM | POA: Diagnosis not present

## 2021-09-14 DIAGNOSIS — G894 Chronic pain syndrome: Secondary | ICD-10-CM | POA: Diagnosis not present

## 2021-09-15 DIAGNOSIS — M2241 Chondromalacia patellae, right knee: Secondary | ICD-10-CM | POA: Diagnosis not present

## 2021-09-15 DIAGNOSIS — M2351 Chronic instability of knee, right knee: Secondary | ICD-10-CM | POA: Diagnosis not present

## 2021-09-28 DIAGNOSIS — M2351 Chronic instability of knee, right knee: Secondary | ICD-10-CM | POA: Diagnosis not present

## 2021-09-28 DIAGNOSIS — M2241 Chondromalacia patellae, right knee: Secondary | ICD-10-CM | POA: Diagnosis not present

## 2021-10-12 DIAGNOSIS — M2351 Chronic instability of knee, right knee: Secondary | ICD-10-CM | POA: Diagnosis not present

## 2021-10-12 DIAGNOSIS — M2241 Chondromalacia patellae, right knee: Secondary | ICD-10-CM | POA: Diagnosis not present

## 2021-10-14 ENCOUNTER — Ambulatory Visit (INDEPENDENT_AMBULATORY_CARE_PROVIDER_SITE_OTHER): Payer: 59

## 2021-10-14 ENCOUNTER — Other Ambulatory Visit: Payer: Self-pay | Admitting: Gastroenterology

## 2021-10-14 DIAGNOSIS — J4550 Severe persistent asthma, uncomplicated: Secondary | ICD-10-CM

## 2021-10-14 DIAGNOSIS — G894 Chronic pain syndrome: Secondary | ICD-10-CM | POA: Diagnosis not present

## 2021-10-14 DIAGNOSIS — K219 Gastro-esophageal reflux disease without esophagitis: Secondary | ICD-10-CM

## 2021-10-22 DIAGNOSIS — M2241 Chondromalacia patellae, right knee: Secondary | ICD-10-CM | POA: Diagnosis not present

## 2021-10-22 DIAGNOSIS — M2351 Chronic instability of knee, right knee: Secondary | ICD-10-CM | POA: Diagnosis not present

## 2021-10-26 DIAGNOSIS — M2351 Chronic instability of knee, right knee: Secondary | ICD-10-CM | POA: Diagnosis not present

## 2021-10-26 DIAGNOSIS — M2241 Chondromalacia patellae, right knee: Secondary | ICD-10-CM | POA: Diagnosis not present

## 2021-11-05 DIAGNOSIS — M2351 Chronic instability of knee, right knee: Secondary | ICD-10-CM | POA: Diagnosis not present

## 2021-11-05 DIAGNOSIS — M2241 Chondromalacia patellae, right knee: Secondary | ICD-10-CM | POA: Diagnosis not present

## 2021-11-09 DIAGNOSIS — M2241 Chondromalacia patellae, right knee: Secondary | ICD-10-CM | POA: Diagnosis not present

## 2021-11-09 DIAGNOSIS — M2351 Chronic instability of knee, right knee: Secondary | ICD-10-CM | POA: Diagnosis not present

## 2021-11-10 DIAGNOSIS — Z23 Encounter for immunization: Secondary | ICD-10-CM | POA: Diagnosis not present

## 2021-11-10 DIAGNOSIS — Z6828 Body mass index (BMI) 28.0-28.9, adult: Secondary | ICD-10-CM | POA: Diagnosis not present

## 2021-11-10 DIAGNOSIS — I10 Essential (primary) hypertension: Secondary | ICD-10-CM | POA: Diagnosis not present

## 2021-11-10 DIAGNOSIS — J302 Other seasonal allergic rhinitis: Secondary | ICD-10-CM | POA: Diagnosis not present

## 2021-11-10 DIAGNOSIS — G894 Chronic pain syndrome: Secondary | ICD-10-CM | POA: Diagnosis not present

## 2021-11-10 DIAGNOSIS — K219 Gastro-esophageal reflux disease without esophagitis: Secondary | ICD-10-CM | POA: Diagnosis not present

## 2021-11-10 DIAGNOSIS — E663 Overweight: Secondary | ICD-10-CM | POA: Diagnosis not present

## 2021-11-10 DIAGNOSIS — J4550 Severe persistent asthma, uncomplicated: Secondary | ICD-10-CM | POA: Diagnosis not present

## 2021-11-12 DIAGNOSIS — M2241 Chondromalacia patellae, right knee: Secondary | ICD-10-CM | POA: Diagnosis not present

## 2021-11-12 DIAGNOSIS — M2351 Chronic instability of knee, right knee: Secondary | ICD-10-CM | POA: Diagnosis not present

## 2021-11-17 ENCOUNTER — Other Ambulatory Visit: Payer: Self-pay | Admitting: Gastroenterology

## 2021-11-17 DIAGNOSIS — K219 Gastro-esophageal reflux disease without esophagitis: Secondary | ICD-10-CM

## 2021-11-18 NOTE — Telephone Encounter (Signed)
Patient has been on pantoprazole 40 mg BID. Not sure why or who may have changed it to lansoprazole 40 mg BID. As he was last seen in December 2021, recommend OV for additional refills.  OK for VV.

## 2021-11-23 ENCOUNTER — Encounter: Payer: Self-pay | Admitting: Gastroenterology

## 2021-11-23 DIAGNOSIS — M2351 Chronic instability of knee, right knee: Secondary | ICD-10-CM | POA: Diagnosis not present

## 2021-11-23 DIAGNOSIS — M2241 Chondromalacia patellae, right knee: Secondary | ICD-10-CM | POA: Diagnosis not present
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
## 2021-11-24 DIAGNOSIS — M2241 Chondromalacia patellae, right knee: Secondary | ICD-10-CM | POA: Diagnosis not present

## 2021-11-25 ENCOUNTER — Ambulatory Visit (INDEPENDENT_AMBULATORY_CARE_PROVIDER_SITE_OTHER): Payer: 59 | Admitting: Gastroenterology

## 2021-11-25 ENCOUNTER — Encounter: Payer: Self-pay | Admitting: Gastroenterology

## 2021-11-25 VITALS — BP 162/96 | HR 69 | Temp 97.9°F | Ht 73.0 in | Wt 218.8 lb

## 2021-11-25 DIAGNOSIS — K219 Gastro-esophageal reflux disease without esophagitis: Secondary | ICD-10-CM

## 2021-11-25 DIAGNOSIS — Z86010 Personal history of colonic polyps: Secondary | ICD-10-CM | POA: Diagnosis not present

## 2021-11-25 NOTE — Patient Instructions (Addendum)
Continue taking Pantoprazole 40 mg daily.   We will arrange for you to have a colonoscopy in the near future with Dr. Gala Romney.   We will see you back in 1 year or sooner if needed.   It was good to meet you today!   Aliene Altes, PA-C [facility]

## 2021-11-26 ENCOUNTER — Telehealth: Payer: Self-pay | Admitting: *Deleted

## 2021-11-26 DIAGNOSIS — M2241 Chondromalacia patellae, right knee: Secondary | ICD-10-CM | POA: Diagnosis not present

## 2021-11-26 DIAGNOSIS — M2351 Chronic instability of knee, right knee: Secondary | ICD-10-CM | POA: Diagnosis not present

## 2021-11-26 NOTE — Telephone Encounter (Signed)
LMTRC to schedule TCS w/Rourk, ASA 2

## 2021-11-26 NOTE — Telephone Encounter (Signed)
Pt left vm to call him back. LMOVM to return call

## 2021-11-30 DIAGNOSIS — M2241 Chondromalacia patellae, right knee: Secondary | ICD-10-CM | POA: Diagnosis not present

## 2021-11-30 DIAGNOSIS — M2351 Chronic instability of knee, right knee: Secondary | ICD-10-CM | POA: Diagnosis not present

## 2021-12-01 NOTE — Telephone Encounter (Signed)
Error. Pt is taking pantoprazole

## 2021-12-01 NOTE — Telephone Encounter (Signed)
Pt states he would like to wait on Dr.Rourk in December. Advised pt will call once we get Decembers schedule.

## 2021-12-01 NOTE — Telephone Encounter (Signed)
Called pt several times. Left message to call office.

## 2021-12-07 DIAGNOSIS — M2351 Chronic instability of knee, right knee: Secondary | ICD-10-CM | POA: Diagnosis not present

## 2021-12-07 DIAGNOSIS — M2241 Chondromalacia patellae, right knee: Secondary | ICD-10-CM | POA: Diagnosis not present

## 2021-12-08 MED ORDER — PEG 3350-KCL-NA BICARB-NACL 420 G PO SOLR: 4000.0000 mL | Freq: Once | ORAL | 0 refills | Status: AC

## 2021-12-08 NOTE — Addendum Note (Signed)
Addended by: Cheron Every on: 12/08/2021 09:29 AM   Modules accepted: Orders

## 2021-12-08 NOTE — Telephone Encounter (Signed)
Called pt. Scheduled for 12/4. He is going to stop by office tomorrow to pick up his instructions. Confirmed pharmacy. Rx sent in

## 2021-12-09 ENCOUNTER — Ambulatory Visit (INDEPENDENT_AMBULATORY_CARE_PROVIDER_SITE_OTHER): Payer: 59

## 2021-12-09 DIAGNOSIS — J4550 Severe persistent asthma, uncomplicated: Secondary | ICD-10-CM

## 2021-12-15 DIAGNOSIS — G894 Chronic pain syndrome: Secondary | ICD-10-CM | POA: Diagnosis not present

## 2021-12-23 DIAGNOSIS — M2241 Chondromalacia patellae, right knee: Secondary | ICD-10-CM | POA: Diagnosis not present

## 2021-12-23 DIAGNOSIS — M2351 Chronic instability of knee, right knee: Secondary | ICD-10-CM | POA: Diagnosis not present

## 2021-12-30 ENCOUNTER — Telehealth: Payer: Self-pay | Admitting: *Deleted

## 2021-12-30 NOTE — Telephone Encounter (Signed)
Called pt, LMOVM advising new arrival time for 12/4 to be 10:15am.

## 2022-01-04 ENCOUNTER — Other Ambulatory Visit: Payer: Self-pay

## 2022-01-04 ENCOUNTER — Ambulatory Visit (HOSPITAL_COMMUNITY): Payer: 59 | Admitting: Anesthesiology

## 2022-01-04 ENCOUNTER — Ambulatory Visit (HOSPITAL_BASED_OUTPATIENT_CLINIC_OR_DEPARTMENT_OTHER): Payer: 59 | Admitting: Anesthesiology

## 2022-01-04 ENCOUNTER — Encounter (HOSPITAL_COMMUNITY): Payer: Self-pay | Admitting: Internal Medicine

## 2022-01-04 ENCOUNTER — Encounter (HOSPITAL_COMMUNITY)
Admission: RE | Disposition: A | Discharge status: Home / Self Care | Payer: Self-pay | Source: Home / Self Care | Attending: Internal Medicine

## 2022-01-04 ENCOUNTER — Ambulatory Visit (HOSPITAL_COMMUNITY)
Admission: RE | Admit: 2022-01-04 | Discharge: 2022-01-04 | Disposition: A | Discharge status: Home / Self Care | Payer: 59 | Attending: Internal Medicine | Admitting: Internal Medicine

## 2022-01-04 DIAGNOSIS — J45909 Unspecified asthma, uncomplicated: Secondary | ICD-10-CM | POA: Insufficient documentation

## 2022-01-04 DIAGNOSIS — Z09 Encounter for follow-up examination after completed treatment for conditions other than malignant neoplasm: Secondary | ICD-10-CM

## 2022-01-04 DIAGNOSIS — K449 Diaphragmatic hernia without obstruction or gangrene: Secondary | ICD-10-CM | POA: Diagnosis not present

## 2022-01-04 DIAGNOSIS — Z1211 Encounter for screening for malignant neoplasm of colon: Secondary | ICD-10-CM | POA: Insufficient documentation

## 2022-01-04 DIAGNOSIS — G709 Myoneural disorder, unspecified: Secondary | ICD-10-CM | POA: Diagnosis not present

## 2022-01-04 DIAGNOSIS — Z86010 Personal history of colonic polyps: Secondary | ICD-10-CM | POA: Insufficient documentation

## 2022-01-04 DIAGNOSIS — Z87891 Personal history of nicotine dependence: Secondary | ICD-10-CM | POA: Insufficient documentation

## 2022-01-04 DIAGNOSIS — K219 Gastro-esophageal reflux disease without esophagitis: Secondary | ICD-10-CM | POA: Insufficient documentation

## 2022-01-04 HISTORY — DX: Allergy to other foods: Z91.018

## 2022-01-04 HISTORY — PX: COLONOSCOPY WITH PROPOFOL: SHX5780

## 2022-01-04 SURGERY — COLONOSCOPY WITH PROPOFOL
Anesthesia: General

## 2022-01-04 MED ORDER — FENTANYL CITRATE (PF) 100 MCG/2ML IJ SOLN: INTRAMUSCULAR | Status: DC | PRN

## 2022-01-04 MED ORDER — LACTATED RINGERS IV SOLN: INTRAVENOUS | Status: DC

## 2022-01-04 MED ORDER — PROPOFOL 10 MG/ML IV BOLUS
INTRAVENOUS | Status: DC | PRN
  Administered 2022-01-04 (×2): 50 mg via INTRAVENOUS

## 2022-01-04 MED ORDER — STERILE WATER FOR IRRIGATION IR SOLN
Status: DC | PRN
  Administered 2022-01-04: .6 mL

## 2022-01-04 MED ORDER — LIDOCAINE HCL (CARDIAC) PF 100 MG/5ML IV SOSY
PREFILLED_SYRINGE | INTRAVENOUS | Status: DC | PRN
  Administered 2022-01-04: 40 mg via INTRAVENOUS
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
## 2022-01-04 NOTE — Op Note (Signed)
[facility] Patient Name: Maurice Richards Procedure Date: 01/04/2022 11:41 AM MRN: [id number] Date of Birth: 10/11/1955 Attending MD: Norvel Richards , MD, [id number] CSN: [id number] Age: 66 Admit Type: Outpatient Procedure:                Colonoscopy Indications:              High risk colon cancer surveillance: Personal                            history of colonic polyps Providers:                Norvel Richards, MD, Janeece Riggers, RN, Ladoris Gene Technician, Technician Referring MD:              Medicines:                Propofol per Anesthesia Complications:            No immediate complications. Estimated Blood Loss:     Estimated blood loss: none. Procedure:                Pre-Anesthesia Assessment:                           - Prior to the procedure, a History and Physical                            was performed, and patient medications and                            allergies were reviewed. The patient's tolerance of                            previous anesthesia was also reviewed. The risks                            and benefits of the procedure and the sedation                            options and risks were discussed with the patient.                            All questions were answered, and informed consent                            was obtained. Prior Anticoagulants: The patient has                            taken no anticoagulant or antiplatelet agents. ASA                            Grade Assessment: II - A patient with mild systemic  disease. After reviewing the risks and benefits,                            the patient was deemed in satisfactory condition to                            undergo the procedure.                           After obtaining informed consent, the colonoscope                            was passed under direct vision. Throughout the                            procedure, the  patient's blood pressure, pulse, and                            oxygen saturations were monitored continuously. The                            [id number]) scope was introduced through the                            anus and advanced to the the cecum, identified by                            appendiceal orifice and ileocecal valve. The                            colonoscopy was performed without difficulty. The                            patient tolerated the procedure well. The quality                            of the bowel preparation was adequate. The                            colonoscopy was performed without difficulty. The                            patient tolerated the procedure well. The quality                            of the bowel preparation was adequate. The                            ileocecal valve, appendiceal orifice, and rectum                            were photographed. Scope In: 12:03:46 PM Scope Out: 12:16:30 PM Scope Withdrawal Time: 0 hours 8 minutes 46 seconds  Total Procedure Duration: 0 hours 12 minutes 44 seconds  Findings:      The perianal and digital rectal examinations were normal.      The colon (entire examined portion) appeared normal.      The retroflexed view of the distal rectum and anal verge was normal and       showed no anal or rectal abnormalities. Estimated blood loss: none. Impression:               - The entire examined colon is normal.                           - The distal rectum and anal verge are normal on                            retroflexion view.                           - No specimens collected. Moderate Sedation:      Moderate (conscious) sedation was personally administered by an       anesthesia professional. The following parameters were monitored: oxygen       saturation, heart rate, blood pressure, respiratory rate, EKG, adequacy       of pulmonary ventilation, and response to care. Recommendation:           -  Patient has a contact number available for                            emergencies. The signs and symptoms of potential                            delayed complications were discussed with the                            patient. Return to normal activities tomorrow.                            Written discharge instructions were provided to the                            patient.                           - Advance diet as tolerated.                           - Continue present medications.                           - Repeat colonoscopy in 7 years for surveillance.                           - Return to GI office (date not yet determined). Procedure Code(s):        --- Professional ---                           [phone], Colonoscopy, flexible; diagnostic, including  collection of specimen(s) by brushing or washing,                            when performed (separate procedure) Diagnosis Code(s):        --- Professional ---                           Z86.010, Personal history of colonic polyps CPT copyright 2022 American Medical Association. All rights reserved. The codes documented in this report are preliminary and upon coder review may  be revised to meet current compliance requirements. Cristopher Estimable. Joleen Stuckert, MD Norvel Richards, MD 01/04/2022 1:01:56 PM This report has been signed electronically. Number of Addenda: 0

## 2022-01-04 NOTE — Anesthesia Postprocedure Evaluation (Signed)
Anesthesia Post Note  Patient: Maurice Richards  Procedure(s) Performed: COLONOSCOPY WITH PROPOFOL  Patient location during evaluation: Phase II Anesthesia Type: General Level of consciousness: awake and alert and oriented Pain management: pain level controlled Vital Signs Assessment: post-procedure vital signs reviewed and stable Respiratory status: spontaneous breathing, nonlabored ventilation and respiratory function stable Cardiovascular status: blood pressure returned to baseline and stable Postop Assessment: no apparent nausea or vomiting Anesthetic complications: no  No notable events documented.   Last Vitals:  Vitals:   01/04/22 1221 01/04/22 1224  BP: 115/73 117/64  Pulse: 77 62  Resp: 14 16  Temp: 36.8 C 37.4 C  SpO2: 96% 99%    Last Pain:  Vitals:   01/04/22 1224  TempSrc: Oral  PainSc:                  Ashvin Adelson C Anali Cabanilla

## 2022-01-04 NOTE — Anesthesia Preprocedure Evaluation (Addendum)
Anesthesia Evaluation  Patient identified by MRN, date of birth, ID band Patient awake    Reviewed: Allergy & Precautions, H&P , NPO status , Patient's Chart, lab work & pertinent test results  Airway Mallampati: II  TM Distance: >3 FB Neck ROM: Full    Dental  (+) Dental Advisory Given, Caps, Chipped   Pulmonary asthma , former smoker   Pulmonary exam normal breath sounds clear to auscultation       Cardiovascular negative cardio ROS Normal cardiovascular exam Rhythm:Regular Rate:Normal     Neuro/Psych  Headaches PSYCHIATRIC DISORDERS Anxiety      Neuromuscular disease    GI/Hepatic hiatal hernia,GERD  Medicated and Controlled,,(+) Hepatitis -  Endo/Other  negative endocrine ROS    Renal/GU negative Renal ROS  negative genitourinary   Musculoskeletal negative musculoskeletal ROS (+)    Abdominal   Peds negative pediatric ROS (+)  Hematology negative hematology ROS (+)   Anesthesia Other Findings   Reproductive/Obstetrics negative OB ROS                             Anesthesia Physical Anesthesia Plan  ASA: 2  Anesthesia Plan: General   Post-op Pain Management: Minimal or no pain anticipated   Induction: Intravenous  PONV Risk Score and Plan: 1 and Propofol infusion  Airway Management Planned: Nasal Cannula and Natural Airway  Additional Equipment:   Intra-op Plan:   Post-operative Plan:   Informed Consent: I have reviewed the patients History and Physical, chart, labs and discussed the procedure including the risks, benefits and alternatives for the proposed anesthesia with the patient or authorized representative who has indicated his/her understanding and acceptance.     Dental advisory given  Plan Discussed with: CRNA and Surgeon  Anesthesia Plan Comments:        Anesthesia Quick Evaluation

## 2022-01-04 NOTE — Discharge Instructions (Signed)
  Colonoscopy Discharge Instructions  Read the instructions outlined below and refer to this sheet in the next few weeks. These discharge instructions provide you with general information on caring for yourself after you leave the hospital. Your doctor may also give you specific instructions. While your treatment has been planned according to the most current medical practices available, unavoidable complications occasionally occur. If you have any problems or questions after discharge, call Dr. Gala Romney at [phone]. ACTIVITY You may resume your regular activity, but move at a slower pace for the next 24 hours.  Take frequent rest periods for the next 24 hours.  Walking will help get rid of the air and reduce the bloated feeling in your belly (abdomen).  No driving for 24 hours (because of the medicine (anesthesia) used during the test).   Do not sign any important legal documents or operate any machinery for 24 hours (because of the anesthesia used during the test).  NUTRITION Drink plenty of fluids.  You may resume your normal diet as instructed by your doctor.  Begin with a light meal and progress to your normal diet. Heavy or fried foods are harder to digest and may make you feel sick to your stomach (nauseated).  Avoid alcoholic beverages for 24 hours or as instructed.  MEDICATIONS You may resume your normal medications unless your doctor tells you otherwise.  WHAT YOU CAN EXPECT TODAY Some feelings of bloating in the abdomen.  Passage of more gas than usual.  Spotting of blood in your stool or on the toilet paper.  IF YOU HAD POLYPS REMOVED DURING THE COLONOSCOPY: No aspirin products for 7 days or as instructed.  No alcohol for 7 days or as instructed.  Eat a soft diet for the next 24 hours.  FINDING OUT THE RESULTS OF YOUR TEST Not all test results are available during your visit. If your test results are not back during the visit, make an appointment with your caregiver to find out the  results. Do not assume everything is normal if you have not heard from your caregiver or the medical facility. It is important for you to follow up on all of your test results.  SEEK IMMEDIATE MEDICAL ATTENTION IF: You have more than a spotting of blood in your stool.  Your belly is swollen (abdominal distention).  You are nauseated or vomiting.  You have a temperature over 101.  You have abdominal pain or discomfort that is severe or gets worse throughout the day.       No polyps found today!    It is recommended you return for repeat colonoscopy in 7 years    It was good seeing you again today!    Merry Christmas!     At patient request, called Ferry Matthis at [phone] -  reviewed findings and recommendations.

## 2022-01-04 NOTE — Transfer of Care (Signed)
Immediate Anesthesia Transfer of Care Note  Patient: Maurice Richards  Procedure(s) Performed: COLONOSCOPY WITH PROPOFOL  Patient Location: PACU  Anesthesia Type:General  Level of Consciousness: awake, alert , and oriented  Airway & Oxygen Therapy: Patient Spontanous Breathing  Post-op Assessment: Report given to RN and Post -op Vital signs reviewed and stable  Post vital signs: Reviewed and stable  Last Vitals:  Vitals Value Taken Time  BP 117/64 01/04/22 1224  Temp 37.4 C 01/04/22 1224  Pulse 62 01/04/22 1224  Resp 16 01/04/22 1224  SpO2 99 % 01/04/22 1224    Last Pain:  Vitals:   01/04/22 1224  TempSrc: Oral  PainSc:       Patients Stated Pain Goal: 5 (74/60/02 9847)  Complications: No notable events documented.

## 2022-01-11 ENCOUNTER — Encounter (HOSPITAL_COMMUNITY): Payer: Self-pay | Admitting: Internal Medicine

## 2022-01-13 ENCOUNTER — Other Ambulatory Visit: Payer: Self-pay | Admitting: Allergy & Immunology

## 2022-01-13 DIAGNOSIS — G894 Chronic pain syndrome: Secondary | ICD-10-CM | POA: Diagnosis not present

## 2022-01-14 ENCOUNTER — Other Ambulatory Visit: Payer: Self-pay | Admitting: Allergy & Immunology

## 2022-02-04 ENCOUNTER — Telehealth: Payer: Self-pay

## 2022-02-04 NOTE — Telephone Encounter (Signed)
Patient called stating his insurance is changing to [facility] and he is wanting to know if it will cover his Maurice Richards? He was scheduled for 02/08/2022 in [facility] , I went ahead and canceled it since his insurance has changed & not sure of coverage. Patient does not have ID numbers yet.

## 2022-02-08 ENCOUNTER — Ambulatory Visit: Payer: 59

## 2022-02-22 NOTE — Telephone Encounter (Signed)
Need new Ins

## 2022-06-22 ENCOUNTER — Other Ambulatory Visit (HOSPITAL_COMMUNITY): Payer: Self-pay | Admitting: Family Medicine

## 2022-06-22 ENCOUNTER — Ambulatory Visit (HOSPITAL_COMMUNITY)
Admission: RE | Admit: 2022-06-22 | Discharge: 2022-06-22 | Disposition: A | Discharge status: Home / Self Care | Payer: Medicare Other | Source: Ambulatory Visit | Attending: Family Medicine | Admitting: Family Medicine

## 2022-06-22 DIAGNOSIS — J209 Acute bronchitis, unspecified: Secondary | ICD-10-CM

## 2022-10-27 ENCOUNTER — Encounter: Payer: Self-pay | Admitting: Gastroenterology

## 2023-10-14 ENCOUNTER — Ambulatory Visit: Payer: PRIVATE HEALTH INSURANCE

## 2023-10-14 VITALS — BP 119/84 | HR 77 | Ht 73.0 in | Wt 242.0 lb

## 2023-10-14 DIAGNOSIS — D3500 Benign neoplasm of unspecified adrenal gland: Secondary | ICD-10-CM | POA: Insufficient documentation

## 2023-10-14 DIAGNOSIS — G894 Chronic pain syndrome: Secondary | ICD-10-CM | POA: Diagnosis not present

## 2023-10-14 DIAGNOSIS — I10 Essential (primary) hypertension: Secondary | ICD-10-CM

## 2023-10-14 DIAGNOSIS — Z23 Encounter for immunization: Secondary | ICD-10-CM

## 2023-10-14 MED ORDER — OXYCODONE-ACETAMINOPHEN 10-325 MG PO TABS: 1.0000 | ORAL_TABLET | Freq: Two times a day (BID) | ORAL | 0 refills | Status: DC
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
## 2023-10-18 DIAGNOSIS — I10 Essential (primary) hypertension: Secondary | ICD-10-CM | POA: Insufficient documentation

## 2023-10-18 DIAGNOSIS — G894 Chronic pain syndrome: Secondary | ICD-10-CM | POA: Insufficient documentation

## 2023-10-18 NOTE — Assessment & Plan Note (Signed)
 Stable with losartan 50 mg.  No refills needed at this time.  Discussed need for weight loss with a healthy, low-sodium diet and regular exercise to maintain normal BP.

## 2023-10-18 NOTE — Assessment & Plan Note (Signed)
 Agree to refill Percocet for chronic pain.  He was being prescribed this by previous PCP.  Pain management contract was signed and a copy given to patient.  PDMP was reviewed, no signs of misuse.  Recommend follow-up in 3 months or sooner if needed.

## 2023-11-14 ENCOUNTER — Other Ambulatory Visit: Payer: Self-pay

## 2023-11-14 ENCOUNTER — Telehealth: Payer: Self-pay

## 2023-11-14 DIAGNOSIS — G894 Chronic pain syndrome: Secondary | ICD-10-CM

## 2023-11-14 MED ORDER — OXYCODONE-ACETAMINOPHEN 10-325 MG PO TABS: 1.0000 | ORAL_TABLET | Freq: Two times a day (BID) | ORAL | 0 refills | Status: DC

## 2023-11-14 NOTE — Telephone Encounter (Signed)
 Pain medicine was sent to [facility] in [place]. Unfortunately we cannot put refills on pain medications, so you will have to call and every month for refill or send a message through MyChart

## 2023-11-14 NOTE — Telephone Encounter (Signed)
 Copied from CRM [id number]. Topic: Clinical - Medication Question >> Nov 14, 2023 10:21 AM Lonell PEDLAR wrote: Reason for CRM: Patient was under the impression that he has additional rx refills for oxyCODONE -acetaminophen  (PERCOCET) 10-325 MG tablet to assist with pain management. Advised pt no refills available, pt requested to speak with clinic.

## 2023-11-15 ENCOUNTER — Telehealth: Payer: Self-pay

## 2023-11-15 NOTE — Telephone Encounter (Signed)
 Pain medicine was sent to [facility] in [place]. Unfortunately we cannot put refills on pain medications, so you will have to call and every month for refill or send a message through MyChart

## 2023-11-15 NOTE — Telephone Encounter (Signed)
 Attempt #1 to call pt no answer left voicemail to call back    Reason for call documented in pts chart

## 2023-12-09 ENCOUNTER — Other Ambulatory Visit: Payer: Self-pay

## 2023-12-09 DIAGNOSIS — G894 Chronic pain syndrome: Secondary | ICD-10-CM

## 2023-12-09 MED ORDER — OXYCODONE-ACETAMINOPHEN 10-325 MG PO TABS: 1.0000 | ORAL_TABLET | Freq: Two times a day (BID) | ORAL | 0 refills | Status: DC

## 2023-12-09 NOTE — Telephone Encounter (Signed)
 Copied from CRM [id number]. Topic: Clinical - Medication Refill >> Dec 09, 2023  9:45 AM Paige D wrote: Medication: oxyCODONE -acetaminophen  (PERCOCET) 10-325 MG tablet  Has the patient contacted their pharmacy? No (Agent: If no, request that the patient contact the pharmacy for the refill. If patient does not wish to contact the pharmacy document the reason why and proceed with request.) (Agent: If yes, when and what did the pharmacy advise?)  This is the patient's preferred pharmacy:   [id number] - [place], [place] - [address] AT [address] [place] [address] Phone: [phone] Fax: [phone]  Is this the correct pharmacy for this prescription? Yes If no, delete pharmacy and type the correct one.   Has the prescription been filled recently? No  Is the patient out of the medication? No, has about 4 -5 days left was told to call back 4-5 days ahead of time.   Has the patient been seen for an appointment in the last year OR does the patient have an upcoming appointment? Yes  Can we respond through MyChart? No, prefers phone call.   Agent: Please be advised that Rx refills may take up to 3 business days. We ask that you follow-up with your pharmacy.

## 2023-12-27 ENCOUNTER — Ambulatory Visit: Payer: PRIVATE HEALTH INSURANCE | Admitting: Physician Assistant

## 2024-01-06 ENCOUNTER — Telehealth: Payer: Self-pay

## 2024-01-06 ENCOUNTER — Other Ambulatory Visit: Payer: Self-pay | Admitting: Internal Medicine

## 2024-01-06 DIAGNOSIS — G894 Chronic pain syndrome: Secondary | ICD-10-CM

## 2024-01-06 MED ORDER — OXYCODONE-ACETAMINOPHEN 10-325 MG PO TABS: 1.0000 | ORAL_TABLET | Freq: Two times a day (BID) | ORAL | 0 refills | Status: DC

## 2024-01-06 NOTE — Telephone Encounter (Signed)
 Patient asking for refills to be sent in for  oxyCODONE -acetaminophen  (PERCOCET) 10-325 MG tablet  [id number] - [place],  - [address] AT [address]

## 2024-01-06 NOTE — Telephone Encounter (Signed)
 Pt advised.

## 2024-01-27 ENCOUNTER — Ambulatory Visit: Payer: PRIVATE HEALTH INSURANCE

## 2024-02-13 ENCOUNTER — Other Ambulatory Visit: Payer: Self-pay

## 2024-02-13 DIAGNOSIS — G894 Chronic pain syndrome: Secondary | ICD-10-CM

## 2024-02-13 MED ORDER — OXYCODONE-ACETAMINOPHEN 10-325 MG PO TABS: 1.0000 | ORAL_TABLET | Freq: Two times a day (BID) | ORAL | 0 refills | Status: DC

## 2024-02-14 ENCOUNTER — Telehealth: Payer: Self-pay

## 2024-02-14 ENCOUNTER — Other Ambulatory Visit: Payer: Self-pay

## 2024-02-14 DIAGNOSIS — G894 Chronic pain syndrome: Secondary | ICD-10-CM

## 2024-02-14 MED ORDER — OXYCODONE-ACETAMINOPHEN 10-325 MG PO TABS: 1.0000 | ORAL_TABLET | Freq: Two times a day (BID) | ORAL | 0 refills | Status: AC

## 2024-02-14 NOTE — Telephone Encounter (Signed)
 Copied from CRM [id number]. Topic: Clinical - Prescription Issue >> Feb 14, 2024 10:44 AM Ahlexyia S wrote: Reason for CRM: Pt called in requesting update on medication oxyCODONE -acetaminophen  (PERCOCET) 10-325 MG tablet. Per pt chart medication was sent to [facility] and pt requested for medication to go to [facility] store [phone]. Pt preferred pharmacies has been updated and pt is requesting a callback when it has been sent to correct pharmacy.

## 2024-02-14 NOTE — Telephone Encounter (Signed)
 Patient advised.

## 2024-02-14 NOTE — Telephone Encounter (Signed)
 Prescription sent to [facility].

## 2024-02-16 NOTE — Patient Instructions (Signed)
 Maurice Richards,  Thank you for taking the time for your Medicare Wellness Visit. I appreciate your continued commitment to your health goals. Please review the care plan we discussed, and feel free to reach out if I can assist you further.  Please note that Annual Wellness Visits do not include a physical exam. Some assessments may be limited, especially if the visit was conducted virtually. If needed, we may recommend an in-person follow-up with your provider.  Ongoing Care Seeing your primary care provider every 3 to 6 months helps us  monitor your health and provide consistent, personalized care.   Referrals If a referral was made during today's visit and you haven't received any updates within two weeks, please contact the referred provider directly to check on the status.  Recommended Screenings:  Health Maintenance  Topic Date Due   Medicare Annual Wellness Visit  Never done   COVID-19 Vaccine (1) Never done   Pneumococcal Vaccine for age over 63 (1 of 2 - PCV) Never done   Zoster (Shingles) Vaccine (1 of 2) Never done   Colon Cancer Screening  01/04/2029   DTaP/Tdap/Td vaccine (2 - Td or Tdap) 06/29/2031   Flu Shot  Completed   Hepatitis C Screening  Completed   Meningitis B Vaccine  Aged Out       01/04/2022   10:17 AM  Advanced Directives  Does Patient Have a Medical Advance Directive? No  Would patient like information on creating a medical advance directive? No - Patient declined    Vision: Annual vision screenings are recommended for early detection of glaucoma, cataracts, and diabetic retinopathy. These exams can also reveal signs of chronic conditions such as diabetes and high blood pressure.  Dental: Annual dental screenings help detect early signs of oral cancer, gum disease, and other conditions linked to overall health, including heart disease and diabetes.  Please see the attached documents for additional preventive care recommendations.

## 2024-02-17 ENCOUNTER — Ambulatory Visit: Payer: PRIVATE HEALTH INSURANCE

## 2024-02-17 VITALS — Ht 73.0 in | Wt 240.0 lb

## 2024-02-17 DIAGNOSIS — Z0000 Encounter for general adult medical examination without abnormal findings: Secondary | ICD-10-CM

## 2024-02-17 DIAGNOSIS — R234 Changes in skin texture: Secondary | ICD-10-CM
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
## 2024-02-29 ENCOUNTER — Ambulatory Visit: Payer: PRIVATE HEALTH INSURANCE

## 2024-05-22 ENCOUNTER — Ambulatory Visit: Payer: Self-pay

## 2025-02-18 ENCOUNTER — Ambulatory Visit: Payer: Self-pay
